# Patient Record
Sex: Female | Born: 1950 | Race: White | Hispanic: No | Marital: Married | Smoking: Never smoker
Health system: Southern US, Community
[De-identification: ages and names within clinical notes are randomized; demographics above are authoritative.]

## PROBLEM LIST (undated history)

## (undated) DIAGNOSIS — E119 Type 2 diabetes mellitus without complications: Secondary | ICD-10-CM

## (undated) DIAGNOSIS — J45909 Unspecified asthma, uncomplicated: Secondary | ICD-10-CM

## (undated) HISTORY — PX: EYE SURGERY: SHX253

---

## 1998-03-19 ENCOUNTER — Other Ambulatory Visit: Admission: RE | Admit: 1998-03-19 | Discharge: 1998-03-19 | Payer: Self-pay | Admitting: Obstetrics and Gynecology

## 1998-04-20 ENCOUNTER — Other Ambulatory Visit: Admission: RE | Admit: 1998-04-20 | Discharge: 1998-04-20 | Payer: Self-pay | Admitting: *Deleted

## 1998-11-30 ENCOUNTER — Other Ambulatory Visit: Admission: RE | Admit: 1998-11-30 | Discharge: 1998-11-30 | Payer: Self-pay | Admitting: Obstetrics and Gynecology

## 1999-08-12 ENCOUNTER — Other Ambulatory Visit: Admission: RE | Admit: 1999-08-12 | Discharge: 1999-08-12 | Payer: Self-pay | Admitting: Gynecology

## 1999-08-30 ENCOUNTER — Ambulatory Visit: Admission: RE | Admit: 1999-08-30 | Discharge: 1999-08-30 | Payer: Self-pay | Admitting: Otolaryngology

## 2001-03-16 ENCOUNTER — Emergency Department (HOSPITAL_COMMUNITY): Admission: EM | Admit: 2001-03-16 | Discharge: 2001-03-16 | Payer: Self-pay | Admitting: Emergency Medicine

## 2002-01-05 ENCOUNTER — Other Ambulatory Visit: Admission: RE | Admit: 2002-01-05 | Discharge: 2002-01-05 | Payer: Self-pay | Admitting: Gynecology

## 2002-03-31 ENCOUNTER — Emergency Department (HOSPITAL_COMMUNITY): Admission: EM | Admit: 2002-03-31 | Discharge: 2002-04-01 | Payer: Self-pay | Admitting: Emergency Medicine

## 2002-08-24 ENCOUNTER — Emergency Department (HOSPITAL_COMMUNITY): Admission: EM | Admit: 2002-08-24 | Discharge: 2002-08-24 | Payer: Self-pay

## 2002-11-23 ENCOUNTER — Emergency Department (HOSPITAL_COMMUNITY): Admission: EM | Admit: 2002-11-23 | Discharge: 2002-11-24 | Payer: Self-pay | Admitting: Emergency Medicine

## 2003-03-10 ENCOUNTER — Other Ambulatory Visit: Admission: RE | Admit: 2003-03-10 | Discharge: 2003-03-10 | Payer: Self-pay | Admitting: Gynecology

## 2003-06-22 ENCOUNTER — Emergency Department (HOSPITAL_COMMUNITY): Admission: EM | Admit: 2003-06-22 | Discharge: 2003-06-22 | Payer: Self-pay | Admitting: Emergency Medicine

## 2003-06-25 ENCOUNTER — Emergency Department (HOSPITAL_COMMUNITY): Admission: EM | Admit: 2003-06-25 | Discharge: 2003-06-25 | Payer: Self-pay | Admitting: Emergency Medicine

## 2004-02-17 ENCOUNTER — Emergency Department (HOSPITAL_COMMUNITY): Admission: EM | Admit: 2004-02-17 | Discharge: 2004-02-17 | Payer: Self-pay | Admitting: Emergency Medicine

## 2004-04-05 ENCOUNTER — Other Ambulatory Visit: Admission: RE | Admit: 2004-04-05 | Discharge: 2004-04-05 | Payer: Self-pay | Admitting: Gynecology

## 2005-05-05 ENCOUNTER — Emergency Department (HOSPITAL_COMMUNITY): Admission: EM | Admit: 2005-05-05 | Discharge: 2005-05-06 | Payer: Self-pay | Admitting: Emergency Medicine

## 2005-12-01 ENCOUNTER — Emergency Department (HOSPITAL_COMMUNITY): Admission: EM | Admit: 2005-12-01 | Discharge: 2005-12-01 | Payer: Self-pay | Admitting: Emergency Medicine

## 2006-10-08 ENCOUNTER — Encounter (INDEPENDENT_AMBULATORY_CARE_PROVIDER_SITE_OTHER): Payer: Self-pay | Admitting: Specialist

## 2006-10-08 ENCOUNTER — Ambulatory Visit (HOSPITAL_COMMUNITY): Admission: RE | Admit: 2006-10-08 | Discharge: 2006-10-09 | Payer: Self-pay | Admitting: Otolaryngology

## 2006-12-10 ENCOUNTER — Encounter: Admission: RE | Admit: 2006-12-10 | Discharge: 2006-12-10 | Payer: Self-pay | Admitting: Obstetrics and Gynecology

## 2007-07-23 ENCOUNTER — Emergency Department (HOSPITAL_COMMUNITY): Admission: EM | Admit: 2007-07-23 | Discharge: 2007-07-23 | Payer: Self-pay | Admitting: Emergency Medicine

## 2007-07-25 ENCOUNTER — Emergency Department (HOSPITAL_COMMUNITY): Admission: EM | Admit: 2007-07-25 | Discharge: 2007-07-25 | Payer: Self-pay | Admitting: Emergency Medicine

## 2007-12-22 ENCOUNTER — Encounter: Admission: RE | Admit: 2007-12-22 | Discharge: 2007-12-22 | Payer: Self-pay | Admitting: Obstetrics and Gynecology

## 2008-12-29 ENCOUNTER — Ambulatory Visit: Payer: Self-pay | Admitting: Diagnostic Radiology

## 2008-12-29 ENCOUNTER — Ambulatory Visit (HOSPITAL_BASED_OUTPATIENT_CLINIC_OR_DEPARTMENT_OTHER): Admission: RE | Admit: 2008-12-29 | Discharge: 2008-12-29 | Payer: Self-pay | Admitting: Obstetrics and Gynecology

## 2009-05-18 DIAGNOSIS — R0609 Other forms of dyspnea: Secondary | ICD-10-CM | POA: Insufficient documentation

## 2009-05-18 DIAGNOSIS — R0989 Other specified symptoms and signs involving the circulatory and respiratory systems: Secondary | ICD-10-CM | POA: Insufficient documentation

## 2009-05-18 DIAGNOSIS — E785 Hyperlipidemia, unspecified: Secondary | ICD-10-CM | POA: Insufficient documentation

## 2009-05-18 DIAGNOSIS — F411 Generalized anxiety disorder: Secondary | ICD-10-CM | POA: Insufficient documentation

## 2009-05-18 DIAGNOSIS — E119 Type 2 diabetes mellitus without complications: Secondary | ICD-10-CM | POA: Insufficient documentation

## 2009-05-21 ENCOUNTER — Encounter: Payer: Self-pay | Admitting: Pulmonary Disease

## 2009-12-31 ENCOUNTER — Ambulatory Visit (HOSPITAL_BASED_OUTPATIENT_CLINIC_OR_DEPARTMENT_OTHER): Admission: RE | Admit: 2009-12-31 | Discharge: 2009-12-31 | Payer: Self-pay | Admitting: Obstetrics and Gynecology

## 2009-12-31 ENCOUNTER — Ambulatory Visit: Payer: Self-pay | Admitting: Radiology

## 2010-05-25 ENCOUNTER — Emergency Department (HOSPITAL_BASED_OUTPATIENT_CLINIC_OR_DEPARTMENT_OTHER): Admission: EM | Admit: 2010-05-25 | Discharge: 2010-05-25 | Payer: Self-pay | Admitting: Emergency Medicine

## 2010-11-03 ENCOUNTER — Emergency Department (HOSPITAL_COMMUNITY)
Admission: EM | Admit: 2010-11-03 | Discharge: 2010-11-03 | Payer: Self-pay | Source: Home / Self Care | Admitting: Emergency Medicine

## 2010-11-03 ENCOUNTER — Emergency Department (HOSPITAL_COMMUNITY)
Admission: EM | Admit: 2010-11-03 | Discharge: 2010-11-04 | Payer: Self-pay | Source: Home / Self Care | Admitting: Emergency Medicine

## 2010-11-11 LAB — URINALYSIS, ROUTINE W REFLEX MICROSCOPIC
Bilirubin Urine: NEGATIVE
Hgb urine dipstick: NEGATIVE
Ketones, ur: NEGATIVE mg/dL
Leukocytes, UA: NEGATIVE
Nitrite: NEGATIVE
Protein, ur: NEGATIVE mg/dL
Specific Gravity, Urine: 1.036 — ABNORMAL HIGH (ref 1.005–1.030)
Urine Glucose, Fasting: >1000 mg/dL — AB
Urobilinogen, UA: 0.2 mg/dL (ref 0.0–1.0)
pH: 5 (ref 5.0–8.0)

## 2010-11-11 LAB — CBC
HCT: 35.1 % — ABNORMAL LOW (ref 36.0–46.0)
Hemoglobin: 12.1 g/dL (ref 12.0–15.0)
MCH: 27.9 pg (ref 26.0–34.0)
MCHC: 34.5 g/dL (ref 30.0–36.0)
MCV: 80.9 fL (ref 78.0–100.0)
Platelets: 357 10*3/uL (ref 150–400)
RBC: 4.34 MIL/uL (ref 3.87–5.11)
RDW: 13.1 % (ref 11.5–15.5)
WBC: 17.4 10*3/uL — ABNORMAL HIGH (ref 4.0–10.5)

## 2010-11-11 LAB — BLOOD GAS, ARTERIAL
Acid-base deficit: 3.7 mmol/L — ABNORMAL HIGH (ref 0.0–2.0)
Bicarbonate: 20.3 mEq/L (ref 20.0–24.0)
Drawn by: 257701
FIO2: 0.21 %
O2 Saturation: 97.7 %
Patient temperature: 98.6
TCO2: 18.7 mmol/L (ref 0–100)
pCO2 arterial: 34.6 mmHg — ABNORMAL LOW (ref 35.0–45.0)
pH, Arterial: 7.385 (ref 7.350–7.400)
pO2, Arterial: 97.3 mmHg (ref 80.0–100.0)

## 2010-11-11 LAB — DIFFERENTIAL
Basophils Absolute: 0 10*3/uL (ref 0.0–0.1)
Basophils Relative: 0 % (ref 0–1)
Eosinophils Absolute: 0 10*3/uL (ref 0.0–0.7)
Eosinophils Relative: 0 % (ref 0–5)
Lymphocytes Relative: 11 % — ABNORMAL LOW (ref 12–46)
Lymphs Abs: 1.8 10*3/uL (ref 0.7–4.0)
Monocytes Absolute: 1.1 10*3/uL — ABNORMAL HIGH (ref 0.1–1.0)
Monocytes Relative: 6 % (ref 3–12)
Neutro Abs: 14.4 10*3/uL — ABNORMAL HIGH (ref 1.7–7.7)
Neutrophils Relative %: 83 % — ABNORMAL HIGH (ref 43–77)

## 2010-11-11 LAB — BASIC METABOLIC PANEL
BUN: 17 mg/dL (ref 6–23)
CO2: 20 mEq/L (ref 19–32)
Calcium: 9.9 mg/dL (ref 8.4–10.5)
Chloride: 99 mEq/L (ref 96–112)
Creatinine, Ser: 1.06 mg/dL (ref 0.4–1.2)
GFR calc Af Amer: >60 mL/min (ref >60)
GFR calc non Af Amer: 53 mL/min — ABNORMAL LOW (ref >60)
Glucose, Bld: 571 mg/dL (ref 70–99)
Potassium: 4.5 mEq/L (ref 3.5–5.1)
Sodium: 131 mEq/L — ABNORMAL LOW (ref 135–145)

## 2010-11-11 LAB — GLUCOSE, CAPILLARY
Glucose-Capillary: >600 mg/dL (ref 70–99)
Glucose-Capillary: 348 mg/dL — ABNORMAL HIGH (ref 70–99)
Glucose-Capillary: 225 mg/dL — ABNORMAL HIGH (ref 70–99)

## 2010-11-11 LAB — URINE MICROSCOPIC-ADD ON

## 2010-11-17 ENCOUNTER — Encounter: Payer: Self-pay | Admitting: Obstetrics and Gynecology

## 2011-03-05 DIAGNOSIS — H1870 Unspecified corneal deformity: Secondary | ICD-10-CM | POA: Insufficient documentation

## 2011-03-14 NOTE — H&P (Signed)
NAMEMarland Mccoy  Kim, Mccoy NO.:  [id number]   MEDICAL RECORD NO.:  [id number]          PATIENT TYPE:  AMB   LOCATION:  SDS                          FACILITY:  [facility]   PHYSICIAN:  Hermelinda Medicus, M.D.   DATE OF BIRTH:  10-Jul-1951   DATE OF ADMISSION:  10/08/2006  DATE OF DISCHARGE:                              HISTORY & PHYSICAL   This patient is a 60 year old female who is a patient of Dr. Quintella Reichert and  also Dr. Beaulah Dinning.  She has been on medication for allergies but  apparently never had any allergy treatment via allergy regularly taking  allergy shots.  She has been on steroids recently for her asthma and has  had a flu shot recently.  She has had a long-term history of asthma and  has been treated for this in the past.  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  She now enters for a septal reconstruction turbinate  reduction in the hopes that she will not only improve her nasal airway  but also improve her breathing as well as her sinus issues and her  asthma issues.   PAST MEDICAL HISTORY:  HER PAST HISTORY REVEALS SHE IS ALLERGIC TO Z-  PAK, E-MYCIN, THE MYCIN DRUGS.   MEDICATIONS:  1. Celexa 20.  2. Prempro 0.3/1.5 mg daily.  3. Zyrtec 10.  4. Metformin 500 mg twice a day.  5. Ventolin inhaler which she uses p.r.n.   Her further lab work does show her glucose to  be 143 this morning.  Chest x-ray showed no active disease.  EKG was normal sinus rhythm.  Her  last asthma attack was October 2007, and she is watching her sugar but  still takes the diabetic medication.   Further history is one of having had a tubal ligation, appendectomy,  ovarian cyst removed.  Her last usual CBG's are 130 to 140.   PHYSICAL EXAMINATION:  VITAL SIGNS:  Blood pressure 151/83, pulse 88.  HEENT:  Ears are clear.  Tympanic membranes are clear.   The nose shows  septal deviation especially to the right with a large bone spur as well  as an ethmoid deviation to the right with a left inferior turbinate  hypertrophy.  Her oral cavity is clear.  Her larynx is clear, true  cords, false cords, epiglottic space, and tongue are clear of ulceration  or mass.  True cord, __________, gag reflex and facial nerve are all  symmetrical.  NECK:  The neck is free of any thyromegaly, cervical adenopathy, or  mass.  CHEST:  Clear, no rales, rhonchi, or wheezes.  CARDIOVASCULAR:  No S3, S4, murmurs, rubs, or gallops.  ABDOMEN:  Unremarkable.  EXTREMITIES:  Unremarkable.   INITIAL DIAGNOSES:  1. Septal deviation.  2. History of sinusitis and bronchitis.  3. History of asthma.  4. History of diabetes.  5. History of tubal ligation, appendectomy, and ovarian cyst.  ______________________________  Hermelinda Medicus, M.D.     JC/MEDQ  D:  10/08/2006  T:  10/08/2006  Job:  [id number]   cc:   Lacretia Leigh. Quintella Reichert, M.D.  Stephannie Li, M.D.

## 2011-03-14 NOTE — Op Note (Signed)
Kim Mccoy, Kim Mccoy              ACCOUNT NO.:  [id number]   MEDICAL RECORD NO.:  [id number]          PATIENT TYPE:  AMB   LOCATION:  SDS                          FACILITY:  [facility]   PHYSICIAN:  Hermelinda Medicus, M.D.   DATE OF BIRTH:  Nov 19, 1950   DATE OF PROCEDURE:  10/08/2006  DATE OF DISCHARGE:                               OPERATIVE REPORT   PREOPERATIVE DIAGNOSIS:  Septal deviation, turbinate hypertrophy, nasal  obstruction, history of bronchitis, history of asthma, history of  sinusitis.   POSTOPERATIVE DIAGNOSIS:  Septal deviation, turbinate hypertrophy, nasal  obstruction, history of bronchitis, history of asthma, history of  sinusitis.   OPERATION:  Septal reconstruction, turbinate reduction.   OPERATOR:  Dr. Hermelinda Medicus.   ANESTHESIA:  Is local MAC with Dr. Gelene Mink.   PROCEDURE:  The patient was placed in supine position, and under local  MAC anesthesia, we prepped and draped in the usual manner using the  usual head drape, and then the anesthesia was begun using topical  cocaine 200 mg and 1% Xylocaine with epinephrine using 6 mL.  The septum  was first approached making a hemitransfixion incision on the right  side, carrying it back along the quadrilateral cartilage to the  cartilaginous deviation, and a strip of cartilage was taken from the  posterior aspect and also from the floor of the nose.  The premaxillary  spur was taken down the using the 4-mm chisel, and this was removed  using the Takahashi forceps.  The open and closed Philipp Ovens were  also used to remove the ethmoid and a portion of vomerine septal  deviation, and then further vomerine spur was pushing right over into  the side of the nose on the right side, and this was removed using the  open and closed Philipp Ovens and the Griffith forceps.  Once the  septum was brought back to the midline, the closure was begun using 5-0  plain catgut and through-and-through septal suture using 4-0  plain x2.  Once this was completed, the inferior turbinates were outfractured  aggressively in the _________ using a 12 was used to also shrink the  mucous membranes of the inferior turbinates.  Once this was completed,  the intranasal dressing was then placed, and the patient tolerated the  procedure well and will be kept overnight because of her asthma history  and on 23-hour observation.   Follow-up furthermore will be then in 5 days, in 2 weeks, 4 weeks and 6  weeks and then 3 months.           ______________________________  Hermelinda Medicus, M.D.     JC/MEDQ  D:  10/08/2006  T:  10/08/2006  Job:  [id number]   cc:   Lacretia Leigh. Quintella Reichert, M.D.  Beaulah Dinning, M.D.

## 2011-03-31 DIAGNOSIS — H1810 Bullous keratopathy, unspecified eye: Secondary | ICD-10-CM | POA: Insufficient documentation

## 2011-03-31 DIAGNOSIS — H43819 Vitreous degeneration, unspecified eye: Secondary | ICD-10-CM | POA: Insufficient documentation

## 2011-03-31 DIAGNOSIS — H2700 Aphakia, unspecified eye: Secondary | ICD-10-CM | POA: Insufficient documentation

## 2011-04-14 ENCOUNTER — Other Ambulatory Visit (HOSPITAL_BASED_OUTPATIENT_CLINIC_OR_DEPARTMENT_OTHER): Payer: Self-pay | Admitting: Obstetrics and Gynecology

## 2011-04-14 DIAGNOSIS — Z1231 Encounter for screening mammogram for malignant neoplasm of breast: Secondary | ICD-10-CM

## 2011-04-17 ENCOUNTER — Ambulatory Visit (HOSPITAL_BASED_OUTPATIENT_CLINIC_OR_DEPARTMENT_OTHER)
Admission: RE | Admit: 2011-04-17 | Discharge: 2011-04-17 | Disposition: A | Discharge status: Home / Self Care | Payer: 59 | Source: Ambulatory Visit | Attending: Obstetrics and Gynecology | Admitting: Obstetrics and Gynecology

## 2011-04-17 DIAGNOSIS — Z1231 Encounter for screening mammogram for malignant neoplasm of breast: Secondary | ICD-10-CM | POA: Insufficient documentation

## 2011-08-07 LAB — DIFFERENTIAL
Basophils Absolute: 0.3 — ABNORMAL HIGH
Basophils Relative: 1
Basophils Relative: 2 — ABNORMAL HIGH
Eosinophils Absolute: 0.3
Eosinophils Absolute: 0.4
Eosinophils Relative: 3
Eosinophils Relative: 3
Lymphocytes Relative: 19
Lymphs Abs: 3.1
Monocytes Absolute: 0.6
Monocytes Absolute: 0.9 — ABNORMAL HIGH
Monocytes Relative: 6
Monocytes Relative: 6
Neutro Abs: 11.6 — ABNORMAL HIGH
Neutrophils Relative %: 59
Neutrophils Relative %: 71

## 2011-08-07 LAB — BASIC METABOLIC PANEL
BUN: 7
BUN: 8
CO2: 25
CO2: 27
Calcium: 10.1
Calcium: 9.3
Chloride: 100
Chloride: 97
Creatinine, Ser: 0.62
Creatinine, Ser: 0.83
GFR calc Af Amer: >60
GFR calc Af Amer: >60
GFR calc non Af Amer: >60
GFR calc non Af Amer: >60
Glucose, Bld: 273 — ABNORMAL HIGH
Glucose, Bld: 380 — ABNORMAL HIGH
Potassium: 3.6
Potassium: 4
Sodium: 131 — ABNORMAL LOW
Sodium: 135

## 2011-08-07 LAB — CBC
HCT: 37.8
HCT: 38.4
Hemoglobin: 13.4
Hemoglobin: 13.4
MCHC: 34.8
MCHC: 35.6
MCV: 81
MCV: 81.6
Platelets: 452 — ABNORMAL HIGH
RBC: 4.67
RBC: 4.7
RDW: 12.7
WBC: 16.3 — ABNORMAL HIGH

## 2011-08-07 LAB — URINE MICROSCOPIC-ADD ON

## 2011-08-07 LAB — URINALYSIS, ROUTINE W REFLEX MICROSCOPIC
Bilirubin Urine: NEGATIVE
Bilirubin Urine: NEGATIVE
Glucose, UA: >1000 — AB
Hgb urine dipstick: NEGATIVE
Hgb urine dipstick: NEGATIVE
Ketones, ur: 15 — AB
Ketones, ur: NEGATIVE
Leukocytes, UA: NEGATIVE
Nitrite: NEGATIVE
Protein, ur: NEGATIVE
Protein, ur: NEGATIVE
Specific Gravity, Urine: 1.038 — ABNORMAL HIGH
Urobilinogen, UA: 0.2
Urobilinogen, UA: 1
pH: 5.5

## 2012-03-09 ENCOUNTER — Other Ambulatory Visit (HOSPITAL_BASED_OUTPATIENT_CLINIC_OR_DEPARTMENT_OTHER): Payer: Self-pay | Admitting: Obstetrics and Gynecology

## 2012-03-09 DIAGNOSIS — Z1231 Encounter for screening mammogram for malignant neoplasm of breast: Secondary | ICD-10-CM

## 2012-04-16 ENCOUNTER — Ambulatory Visit (HOSPITAL_BASED_OUTPATIENT_CLINIC_OR_DEPARTMENT_OTHER)
Admission: RE | Admit: 2012-04-16 | Discharge: 2012-04-16 | Disposition: A | Discharge status: Home / Self Care | Payer: 59 | Source: Ambulatory Visit | Attending: Obstetrics and Gynecology | Admitting: Obstetrics and Gynecology

## 2012-04-16 DIAGNOSIS — Z1231 Encounter for screening mammogram for malignant neoplasm of breast: Secondary | ICD-10-CM

## 2012-08-04 ENCOUNTER — Encounter (HOSPITAL_BASED_OUTPATIENT_CLINIC_OR_DEPARTMENT_OTHER): Payer: Self-pay | Admitting: *Deleted

## 2012-08-04 ENCOUNTER — Emergency Department (HOSPITAL_BASED_OUTPATIENT_CLINIC_OR_DEPARTMENT_OTHER)
Admission: EM | Admit: 2012-08-04 | Discharge: 2012-08-04 | Disposition: A | Discharge status: Home / Self Care | Payer: 59 | Attending: Emergency Medicine | Admitting: Emergency Medicine

## 2012-08-04 DIAGNOSIS — E119 Type 2 diabetes mellitus without complications: Secondary | ICD-10-CM | POA: Insufficient documentation

## 2012-08-04 DIAGNOSIS — R739 Hyperglycemia, unspecified: Secondary | ICD-10-CM

## 2012-08-04 HISTORY — DX: Type 2 diabetes mellitus without complications: E11.9

## 2012-08-04 LAB — COMPREHENSIVE METABOLIC PANEL
ALT: 10 U/L (ref 0–35)
CO2: 21 mEq/L (ref 19–32)
Calcium: 9.8 mg/dL (ref 8.4–10.5)
Chloride: 102 mEq/L (ref 96–112)
Creatinine, Ser: 0.8 mg/dL (ref 0.50–1.10)
GFR calc Af Amer: >90 mL/min (ref >90)
GFR calc non Af Amer: 79 mL/min — ABNORMAL LOW (ref >90)
Glucose, Bld: 197 mg/dL — ABNORMAL HIGH (ref 70–99)
Sodium: 136 mEq/L (ref 135–145)
Total Bilirubin: 1.1 mg/dL (ref 0.3–1.2)

## 2012-08-04 LAB — CBC WITH DIFFERENTIAL/PLATELET
Eosinophils Relative: 1 % (ref 0–5)
HCT: 40.2 % (ref 36.0–46.0)
Lymphocytes Relative: 18 % (ref 12–46)
Lymphs Abs: 1.9 10*3/uL (ref 0.7–4.0)
MCV: 88.9 fL (ref 78.0–100.0)
Monocytes Absolute: 0.7 10*3/uL (ref 0.1–1.0)
Neutro Abs: 7.5 10*3/uL (ref 1.7–7.7)
RBC: 4.52 MIL/uL (ref 3.87–5.11)
WBC: 10.3 10*3/uL (ref 4.0–10.5)

## 2012-08-04 LAB — URINALYSIS, ROUTINE W REFLEX MICROSCOPIC
Bilirubin Urine: NEGATIVE
Glucose, UA: NEGATIVE mg/dL
Hgb urine dipstick: NEGATIVE
Ketones, ur: NEGATIVE mg/dL
Protein, ur: NEGATIVE mg/dL
Urobilinogen, UA: 1 mg/dL (ref 0.0–1.0)

## 2012-08-04 LAB — GLUCOSE, CAPILLARY: Glucose-Capillary: 195 mg/dL — ABNORMAL HIGH (ref 70–99)

## 2012-08-04 MED ORDER — ONDANSETRON HCL 4 MG/2ML IJ SOLN
4.0000 mg | Freq: Once | INTRAMUSCULAR | Status: AC
  Administered 2012-08-04: 4 mg via INTRAVENOUS
  Filled 2012-08-04: qty 2

## 2012-08-04 MED ORDER — SODIUM CHLORIDE 0.9 % IV BOLUS (SEPSIS)
1000.0000 mL | Freq: Once | INTRAVENOUS | Status: AC
  Administered 2012-08-04: 1000 mL via INTRAVENOUS

## 2012-08-04 NOTE — ED Provider Notes (Signed)
History     CSN: [id number]  Arrival date & time 08/04/12  5366   First MD Initiated Contact with Patient 08/04/12 [phone]      Chief Complaint  Patient presents with  . Hyperglycemia    (Consider location/radiation/quality/duration/timing/severity/associated sxs/prior treatment) HPI Patient changed from [place] to metformin Friday and since change on Saturday patient states she is feeling sick- nausea, bs is higher than it has been at 200-220.  Patient thinks the metformin changed to glipizide two days ago and now on 10 mg one time per day.  Continues to feel nauseated.  Called her eye surgeon and diamox to be changed.  Patient seen at urgent care at her doctor's on Sunday because she was vomiting and meds changed.  Patient has felt hot and sweaty  But denies fever or diarrhea.  Decreased po.  Patient denies exposure to any recent illness.  She thinks she may need fluids today.   Past Medical History  Diagnosis Date  . Diabetes mellitus without complication     Past Surgical History  Procedure Date  . Eye surgery     History reviewed. No pertinent family history.  History  Substance Use Topics  . Smoking status: Never Smoker   . Smokeless tobacco: Not on file  . Alcohol Use: No    OB History    Grav Para Term Preterm Abortions TAB SAB Ect Mult Living                  Review of Systems  Constitutional: Negative for fever, chills, activity change, appetite change and unexpected weight change.  HENT: Negative for sore throat, rhinorrhea, neck pain, neck stiffness and sinus pressure.   Eyes: Negative for visual disturbance.  Respiratory: Negative for cough and shortness of breath.   Cardiovascular: Negative for chest pain and leg swelling.  Gastrointestinal: Negative for vomiting, abdominal pain, diarrhea and blood in stool.  Genitourinary: Negative for dysuria, urgency, frequency, vaginal discharge and difficulty urinating.  Musculoskeletal: Negative for myalgias,  arthralgias and gait problem.  Skin: Negative for color change and rash.  Neurological: Negative for weakness, light-headedness and headaches.  Hematological: Does not bruise/bleed easily.  Psychiatric/Behavioral: Negative for dysphoric mood.    Allergies  Erythromycin and Hydrocodone  Home Medications   Current Outpatient Rx  Name Route Sig Dispense Refill  . ACETAZOLAMIDE ER 500 MG PO CP12 Oral Take 500 mg by mouth 2 (two) times daily.    Marland Kitchen BRIMONIDINE TARTRATE 0.1 % OP SOLN      . DORZOLAMIDE HCL-TIMOLOL MAL 22.3-6.8 MG/ML OP SOLN  1 drop 2 (two) times daily.    Marland Kitchen GLIPIZIDE 10 MG PO TABS Oral Take 10 mg by mouth 2 (two) times daily before a meal.    . LATANOPROST 0.005 % OP SOLN  1 drop at bedtime.    Marland Kitchen METFORMIN HCL 500 MG PO TABS Oral Take 500 mg by mouth daily with breakfast. Has not taken since Sunday, pt states she feels this is what is making her feel so fatigued.    Marland Kitchen ZOLPIDEM TARTRATE 10 MG PO TABS Oral Take 10 mg by mouth at bedtime as needed.      BP 132/94  Pulse 66  Temp 98.2 F (36.8 C) (Oral)  Resp 16  Ht 5' 6.5" (1.689 m)  Wt 162 lb (73.483 kg)  BMI 25.76 kg/m2  SpO2 100%  Physical Exam  Nursing note and vitals reviewed. Constitutional: She appears well-developed and well-nourished.  HENT:  Head: Normocephalic and  atraumatic.  Eyes: Conjunctivae normal and EOM are normal. Pupils are equal, round, and reactive to light.  Neck: Normal range of motion. Neck supple.  Cardiovascular: Normal rate, regular rhythm, normal heart sounds and intact distal pulses.   Pulmonary/Chest: Effort normal and breath sounds normal.  Abdominal: Soft. Bowel sounds are normal.  Musculoskeletal: Normal range of motion.  Neurological: She is alert.  Skin: Skin is warm and dry.  Psychiatric: She has a normal mood and affect. Thought content normal.    ED Course  Procedures (including critical care time)  Labs Reviewed  GLUCOSE, CAPILLARY - Abnormal; Notable for the  following:    Glucose-Capillary 195 (*)     All other components within normal limits   No results found.   No diagnosis found. Results for orders placed during the hospital encounter of 08/04/12  GLUCOSE, CAPILLARY      Component Value Range   Glucose-Capillary 195 (*) 70 - 99 mg/dL  CBC WITH DIFFERENTIAL      Component Value Range   WBC 10.3  4.0 - 10.5 K/uL   RBC 4.52  3.87 - 5.11 MIL/uL   Hemoglobin 13.0  12.0 - 15.0 g/dL   HCT 40.9  81.1 - 91.4 %   MCV 88.9  78.0 - 100.0 fL   MCH 28.8  26.0 - 34.0 pg   MCHC 32.3  30.0 - 36.0 g/dL   RDW 78.2  95.6 - 21.3 %   Platelets 326  150 - 400 K/uL   Neutrophils Relative 73  43 - 77 %   Neutro Abs 7.5  1.7 - 7.7 K/uL   Lymphocytes Relative 18  12 - 46 %   Lymphs Abs 1.9  0.7 - 4.0 K/uL   Monocytes Relative 7  3 - 12 %   Monocytes Absolute 0.7  0.1 - 1.0 K/uL   Eosinophils Relative 1  0 - 5 %   Eosinophils Absolute 0.1  0.0 - 0.7 K/uL   Basophils Relative 1  0 - 1 %   Basophils Absolute 0.1  0.0 - 0.1 K/uL  COMPREHENSIVE METABOLIC PANEL      Component Value Range   Sodium 136  135 - 145 mEq/L   Potassium 3.7  3.5 - 5.1 mEq/L   Chloride 102  96 - 112 mEq/L   CO2 21  19 - 32 mEq/L   Glucose, Bld 197 (*) 70 - 99 mg/dL   BUN 10  6 - 23 mg/dL   Creatinine, Ser 0.86  0.50 - 1.10 mg/dL   Calcium 9.8  8.4 - 57.8 mg/dL   Total Protein 7.0  6.0 - 8.3 g/dL   Albumin 4.5  3.5 - 5.2 g/dL   AST 13  0 - 37 U/L   ALT 10  0 - 35 U/L   Alkaline Phosphatase 113  39 - 117 U/L   Total Bilirubin 1.1  0.3 - 1.2 mg/dL   GFR calc non Af Amer 79 (*) >90 mL/min   GFR calc Af Amer >90  >90 mL/min  LIPASE, BLOOD      Component Value Range   Lipase 29  11 - 59 U/L  URINALYSIS, ROUTINE W REFLEX MICROSCOPIC      Component Value Range   Color, Urine YELLOW  YELLOW   APPearance CLEAR  CLEAR   Specific Gravity, Urine 1.007  1.005 - 1.030   pH 6.5  5.0 - 8.0   Glucose, UA NEGATIVE  NEGATIVE mg/dL   Hgb urine dipstick NEGATIVE  NEGATIVE   Bilirubin  Urine NEGATIVE  NEGATIVE   Ketones, ur NEGATIVE  NEGATIVE mg/dL   Protein, ur NEGATIVE  NEGATIVE mg/dL   Urobilinogen, UA 1.0  0.0 - 1.0 mg/dL   Nitrite NEGATIVE  NEGATIVE   Leukocytes, UA NEGATIVE  NEGATIVE      MDM   Patient received 1 L normal saline and feels greatly improved. I have reviewed her labs with her and discussed the hyperglycemia. She will follow with her doctor and decide about any changes needed medication. Not had any vomiting or fever while here and again states that she feels greatly improved after the liter of normal saline.     Hilario Quarry, MD 08/04/12 1101

## 2012-08-04 NOTE — ED Notes (Signed)
Pt states that she saw her pcp on Friday, he adjusted her hyperglycemic meds and her fsbs have been over 200 since then. She was seen at urgent care on Sunday for fsbs of 201. Pt states the md called in glipizide for her at her insistence, "he really didn't want to though". Pt states her fsbs was 220 this am and she has been feeling fatigued and nauseous, denies any emesis, diarrhea, fevers or any other c/o at this time. fsbs checked while pt being triaged, 195.

## 2012-08-26 DIAGNOSIS — H4050X0 Glaucoma secondary to other eye disorders, unspecified eye, stage unspecified: Secondary | ICD-10-CM | POA: Insufficient documentation

## 2012-08-27 DIAGNOSIS — J45909 Unspecified asthma, uncomplicated: Secondary | ICD-10-CM | POA: Insufficient documentation

## 2012-11-15 DIAGNOSIS — F4323 Adjustment disorder with mixed anxiety and depressed mood: Secondary | ICD-10-CM | POA: Insufficient documentation

## 2012-11-15 DIAGNOSIS — G4700 Insomnia, unspecified: Secondary | ICD-10-CM | POA: Insufficient documentation

## 2013-02-11 ENCOUNTER — Other Ambulatory Visit (HOSPITAL_BASED_OUTPATIENT_CLINIC_OR_DEPARTMENT_OTHER): Payer: Self-pay | Admitting: Obstetrics and Gynecology

## 2013-02-11 DIAGNOSIS — Z1231 Encounter for screening mammogram for malignant neoplasm of breast: Secondary | ICD-10-CM

## 2013-04-02 DIAGNOSIS — E559 Vitamin D deficiency, unspecified: Secondary | ICD-10-CM | POA: Insufficient documentation

## 2013-04-18 ENCOUNTER — Ambulatory Visit (HOSPITAL_BASED_OUTPATIENT_CLINIC_OR_DEPARTMENT_OTHER)
Admission: RE | Admit: 2013-04-18 | Discharge: 2013-04-18 | Disposition: A | Discharge status: Home / Self Care | Payer: 59 | Source: Ambulatory Visit | Attending: Obstetrics and Gynecology | Admitting: Obstetrics and Gynecology

## 2013-04-18 DIAGNOSIS — Z1231 Encounter for screening mammogram for malignant neoplasm of breast: Secondary | ICD-10-CM | POA: Insufficient documentation

## 2014-03-13 ENCOUNTER — Other Ambulatory Visit (HOSPITAL_BASED_OUTPATIENT_CLINIC_OR_DEPARTMENT_OTHER): Payer: Self-pay | Admitting: Obstetrics and Gynecology

## 2014-03-13 DIAGNOSIS — Z1231 Encounter for screening mammogram for malignant neoplasm of breast: Secondary | ICD-10-CM

## 2014-05-19 ENCOUNTER — Ambulatory Visit (HOSPITAL_BASED_OUTPATIENT_CLINIC_OR_DEPARTMENT_OTHER)
Admission: RE | Admit: 2014-05-19 | Discharge: 2014-05-19 | Disposition: A | Discharge status: Home / Self Care | Payer: 59 | Source: Ambulatory Visit | Attending: Obstetrics and Gynecology | Admitting: Obstetrics and Gynecology

## 2014-05-19 DIAGNOSIS — Z1231 Encounter for screening mammogram for malignant neoplasm of breast: Secondary | ICD-10-CM

## 2015-05-07 ENCOUNTER — Other Ambulatory Visit (HOSPITAL_BASED_OUTPATIENT_CLINIC_OR_DEPARTMENT_OTHER): Payer: Self-pay | Admitting: Obstetrics and Gynecology

## 2015-05-07 DIAGNOSIS — Z1231 Encounter for screening mammogram for malignant neoplasm of breast: Secondary | ICD-10-CM

## 2015-06-12 ENCOUNTER — Ambulatory Visit (HOSPITAL_BASED_OUTPATIENT_CLINIC_OR_DEPARTMENT_OTHER): Payer: Self-pay

## 2015-06-22 ENCOUNTER — Ambulatory Visit (HOSPITAL_BASED_OUTPATIENT_CLINIC_OR_DEPARTMENT_OTHER)
Admission: RE | Admit: 2015-06-22 | Discharge: 2015-06-22 | Disposition: A | Discharge status: Home / Self Care | Payer: 59 | Source: Ambulatory Visit | Attending: Obstetrics and Gynecology | Admitting: Obstetrics and Gynecology

## 2015-06-22 DIAGNOSIS — Z1231 Encounter for screening mammogram for malignant neoplasm of breast: Secondary | ICD-10-CM | POA: Diagnosis not present

## 2015-07-12 ENCOUNTER — Ambulatory Visit: Payer: 59 | Admitting: Family Medicine

## 2015-07-16 ENCOUNTER — Ambulatory Visit (INDEPENDENT_AMBULATORY_CARE_PROVIDER_SITE_OTHER): Payer: 59 | Admitting: Family Medicine

## 2015-07-16 ENCOUNTER — Encounter (INDEPENDENT_AMBULATORY_CARE_PROVIDER_SITE_OTHER): Payer: Self-pay

## 2015-07-16 ENCOUNTER — Encounter: Payer: Self-pay | Admitting: Family Medicine

## 2015-07-16 VITALS — BP 132/82 | HR 80 | Ht 66.0 in | Wt 170.0 lb

## 2015-07-16 DIAGNOSIS — M25512 Pain in left shoulder: Secondary | ICD-10-CM | POA: Diagnosis not present

## 2015-07-16 MED ORDER — METHYLPREDNISOLONE ACETATE 40 MG/ML IJ SUSP
40.0000 mg | Freq: Once | INTRAMUSCULAR | Status: AC
  Administered 2015-07-16: 40 mg via INTRA_ARTICULAR

## 2015-07-16 NOTE — Patient Instructions (Signed)
You have rotator cuff impingement Try to avoid painful activities (overhead activities, lifting with extended arm) as much as possible. Aleve 2 tabs twice a day with food OR ibuprofen 3 tabs three times a day with food as needed for pain and inflammation. Can take tylenol in addition to this. Subacromial injection may be beneficial to help with pain and to decrease inflammation - you were given this today. Consider physical therapy with transition to home exercise program - call me in 1-2 weeks if not improving as expected and we can send a referral to [facility] for physical therapy. Do home exercise program with theraband and scapular stabilization exercises daily - these are very important for long term relief even if an injection was given. 3 sets of 10 once a day with the band. If not improving at follow-up we will consider further imaging, physical therapy, and/or nitro patches. Follow up with me in 5-6 weeks for reevaluation.

## 2015-07-18 DIAGNOSIS — M25512 Pain in left shoulder: Secondary | ICD-10-CM | POA: Insufficient documentation

## 2015-07-18 NOTE — Progress Notes (Signed)
PCP: Rosario Adie, MD  Subjective:   HPI: Patient is a 64 y.o. female here for left shoulder pain.  Patient states for at least 6 months she's had left shoulder pain. Has fallen on this several times but did not start with a specific event. Difficulty lying on left side. Pain will come and go but up to 10/10 with certain movements. Worse reaching back and forwards. Right handed. Works on [employer].  Past Medical History  Diagnosis Date  . Diabetes mellitus without complication     Current Outpatient Prescriptions on File Prior to Visit  Medication Sig Dispense Refill  . latanoprost (XALATAN) 0.005 % ophthalmic solution 1 drop at bedtime.    Marland Kitchen zolpidem (AMBIEN) 10 MG tablet Take 10 mg by mouth at bedtime as needed.     No current facility-administered medications on file prior to visit.    Past Surgical History  Procedure Laterality Date  . Eye surgery      Allergies  Allergen Reactions  . Erythromycin   . Hydrocodone     Social History   Social History  . Marital Status: Married    Spouse Name: N/A  . Number of Children: N/A  . Years of Education: N/A   Occupational History  . Not on file.   Social History Main Topics  . Smoking status: Never Smoker   . Smokeless tobacco: Not on file  . Alcohol Use: No  . Drug Use: No  . Sexual Activity: Not on file   Other Topics Concern  . Not on file   Social History Narrative    No family history on file.  BP 132/82 mmHg  Pulse 80  Ht  (1.676 m)  Wt 170 lb (77.111 kg)  BMI 27.45 kg/m2  Review of Systems: See HPI above.    Objective:  Physical Exam:  Gen: NAD  Left shoulder: No swelling, ecchymoses.  No gross deformity. No TTP. FROM with painful arc. Positive Hawkins, Neers. Negative Speeds, Yergasons. Strength 5/5 with empty can and resisted internal/external rotation.  Pain empty can Negative apprehension. NV intact distally.    Assessment & Plan:  1. Left shoulder rotator cuff  impingement - subacromial injection given today.  Discussed nsaids, icing, tylenol.  If not improving consider physical therapy.  Shown home exercises today as well.  F/u in 5-6 weeks.  After informed written consent, patient was seated on exam table. Left shoulder was prepped with alcohol swab and utilizing posterior approach, patient's left subacromial space was injected with 3:1 marcaine: depomedrol. Patient tolerated the procedure well without immediate complications.

## 2015-07-18 NOTE — Assessment & Plan Note (Signed)
subacromial injection given today.  Discussed nsaids, icing, tylenol.  If not improving consider physical therapy.  Shown home exercises today as well.  F/u in 5-6 weeks.  After informed written consent, patient was seated on exam table. Left shoulder was prepped with alcohol swab and utilizing posterior approach, patient's left subacromial space was injected with 3:1 marcaine: depomedrol. Patient tolerated the procedure well without immediate complications.

## 2015-07-31 ENCOUNTER — Telehealth: Payer: Self-pay | Admitting: *Deleted

## 2015-07-31 MED ORDER — NITROGLYCERIN 0.2 MG/HR TD PT24: MEDICATED_PATCH | TRANSDERMAL | Status: DC

## 2015-07-31 NOTE — Telephone Encounter (Signed)
Ok - sent nitro in.  Thanks!

## 2015-07-31 NOTE — Telephone Encounter (Signed)
Patient called stating she was having shoulder pain. She would like to try the nitro patches. Pharmacy is the [facility] in [place].

## 2015-08-29 ENCOUNTER — Ambulatory Visit: Payer: 59 | Admitting: Family Medicine

## 2015-08-29 ENCOUNTER — Telehealth: Payer: Self-pay | Admitting: Family Medicine

## 2015-08-29 NOTE — Telephone Encounter (Signed)
Spoke to patient and she wants to do the MRI.

## 2015-08-29 NOTE — Telephone Encounter (Signed)
We could do this one of two ways.  Typically I see them back first to reevaluate, make sure she's still dealing with the same problem, then order the imaging (x-rays and MRI), call her with the results.  Alternatively we could do the imaging (x-rays, MRI) and see her in the office for follow-up and go over results, next steps.  Either way I would need to see her and I prefer to do the first option.  Insurance usually won't approve the MRI unless you've done x-rays and been reevaluated after 6 weeks of conservative treatment.  If I've only seen her once they may deny it - I'm not sure though, depends on the insurance.

## 2015-09-06 ENCOUNTER — Other Ambulatory Visit: Payer: Self-pay | Admitting: *Deleted

## 2015-09-06 DIAGNOSIS — M25512 Pain in left shoulder: Secondary | ICD-10-CM

## 2015-09-15 ENCOUNTER — Ambulatory Visit (HOSPITAL_BASED_OUTPATIENT_CLINIC_OR_DEPARTMENT_OTHER)
Admission: RE | Admit: 2015-09-15 | Discharge: 2015-09-15 | Disposition: A | Discharge status: Home / Self Care | Payer: 59 | Source: Ambulatory Visit | Attending: Family Medicine | Admitting: Family Medicine

## 2015-09-15 ENCOUNTER — Other Ambulatory Visit: Payer: Self-pay | Admitting: Family Medicine

## 2015-09-15 DIAGNOSIS — M75112 Incomplete rotator cuff tear or rupture of left shoulder, not specified as traumatic: Secondary | ICD-10-CM | POA: Insufficient documentation

## 2015-09-15 DIAGNOSIS — S0095XA Superficial foreign body of unspecified part of head, initial encounter: Secondary | ICD-10-CM

## 2015-09-15 DIAGNOSIS — Z1389 Encounter for screening for other disorder: Secondary | ICD-10-CM | POA: Diagnosis not present

## 2015-09-15 DIAGNOSIS — M25512 Pain in left shoulder: Secondary | ICD-10-CM | POA: Diagnosis not present

## 2015-09-15 DIAGNOSIS — M25812 Other specified joint disorders, left shoulder: Secondary | ICD-10-CM | POA: Insufficient documentation

## 2015-09-15 DIAGNOSIS — M7522 Bicipital tendinitis, left shoulder: Secondary | ICD-10-CM | POA: Insufficient documentation

## 2015-09-15 DIAGNOSIS — M7582 Other shoulder lesions, left shoulder: Secondary | ICD-10-CM | POA: Insufficient documentation

## 2015-09-18 ENCOUNTER — Encounter: Payer: Self-pay | Admitting: Family Medicine

## 2015-09-18 ENCOUNTER — Ambulatory Visit (INDEPENDENT_AMBULATORY_CARE_PROVIDER_SITE_OTHER): Payer: 59 | Admitting: Family Medicine

## 2015-09-18 VITALS — BP 110/76 | HR 96 | Ht 66.0 in | Wt 175.0 lb

## 2015-09-18 DIAGNOSIS — M25512 Pain in left shoulder: Secondary | ICD-10-CM

## 2015-09-18 MED ORDER — METHYLPREDNISOLONE ACETATE 40 MG/ML IJ SUSP
40.0000 mg | Freq: Once | INTRAMUSCULAR | Status: AC
  Administered 2015-09-18: 40 mg via INTRA_ARTICULAR

## 2015-09-24 NOTE — Progress Notes (Signed)
PCP: MANNING, Drue Stager., MD  Subjective:   HPI: Patient is a 64 y.o. female here for left shoulder pain.  9/19: Patient states for at least 6 months she's had left shoulder pain. Has fallen on this several times but did not start with a specific event. Difficulty lying on left side. Pain will come and go but up to 92/42 with certain movements. Worse reaching back and forwards. 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  11/22: Patient returns for intraarticular injection.  Past Medical History  Diagnosis Date  . Diabetes mellitus without complication [facility])     Current Outpatient Prescriptions on File Prior to Visit  Medication Sig Dispense Refill  . ACCU-CHEK AVIVA PLUS test strip 2 (two) times daily. for testing  0  . albuterol (PROVENTIL HFA;VENTOLIN HFA) 108 (90 BASE) MCG/ACT inhaler Inhale 2 puffs into the lungs.    Marland Kitchen atorvastatin (LIPITOR) 20 MG tablet Take 1 tablet by mouth  daily    . Blood Glucose Monitoring Suppl (ACCU-CHEK AVIVA PLUS) W/DEVICE KIT See admin instructions.  0  . clonazePAM (KLONOPIN) 1 MG tablet TAKE ONE TABLET BY MOUTH TWICE A DAY IF NEEDED.  0  . Fenofibrate 50 MG CAPS Take 50 mg by mouth daily.  0  . latanoprost (XALATAN) 0.005 % ophthalmic solution 1 drop at bedtime.    . nitroGLYCERIN (NITRODUR - DOSED IN MG/24 HR) 0.2 mg/hr patch Apply 1/4th patch to affected shoulder, change daily 30 patch 1  . pioglitazone-glimepiride (DUETACT) 30-2 MG per tablet     . prednisoLONE acetate (PRED FORTE) 1 % ophthalmic suspension Apply to eye.    Marland Kitchen QUEtiapine (SEROQUEL) 50 MG tablet Take 50 mg by mouth at bedtime.  0  . timolol (TIMOPTIC) 0.5 % ophthalmic solution Apply to eye.    . Vitamin D, Ergocalciferol, (DRISDOL) 50000 UNITS CAPS capsule   0  . zolpidem (AMBIEN) 10 MG tablet Take 10 mg by mouth at bedtime as needed.     No current facility-administered medications on file prior to visit.    Past Surgical History  Procedure Laterality Date  . Eye surgery       Allergies  Allergen Reactions  . Erythromycin   . Hydrocodone     Social History   Social History  . Marital Status: Married    Spouse Name: N/A  . Number of Children: N/A  . Years of Education: N/A   Occupational History  . Not on file.   Social History Main Topics  . Smoking status: Never Smoker   . Smokeless tobacco: Not on file  . Alcohol Use: No  . Drug Use: No  . Sexual Activity: Not on file   Other Topics Concern  . Not on file   Social History Narrative    No family history on file.  BP 110/76 mmHg  Pulse 96  Ht '5\' 6"'  (1.676 m)  Wt 175 lb (79.379 kg)  BMI 28.26 kg/m2  Review of Systems: See HPI above.    Objective:  Physical Exam:  Gen: NAD Exam not repeated today.  Left shoulder: No swelling, ecchymoses.  No gross deformity. No TTP. FROM with painful arc. Positive Hawkins, Neers. Negative Speeds, Yergasons. Strength 5/5 with empty can and resisted internal/external rotation.  Pain empty can Negative apprehension. NV intact distally.    Assessment & Plan:  1. Left shoulder pain - MRI showed multilobulated paralabral cysts in addition to supraspinatus tendinosis.  She did not improve with injection or home exercises as would expect if  the tendinosis was her main issue.  Cysts suggest either a current small labral tear or one that previously healed.  Discussed options and went ahead with trial of intraarticular cortisone injection.  If not improving would consider ortho referral or trial of physical therapy.    After informed written consent, patient was seated on exam table. Left shoulder was prepped with alcohol swab and utilizing posterior approach, patient's left glenohumeral space was injected with 3:1 marcaine: depomedrol. Patient tolerated the procedure well without immediate complications.

## 2015-09-24 NOTE — Assessment & Plan Note (Signed)
MRI showed multilobulated paralabral cysts in addition to supraspinatus tendinosis.  She did not improve with injection or home exercises as would expect if the tendinosis was her main issue.  Cysts suggest either a current small labral tear or one that previously healed.  Discussed options and went ahead with trial of intraarticular cortisone injection.  If not improving would consider ortho referral or trial of physical therapy.    After informed written consent, patient was seated on exam table. Left shoulder was prepped with alcohol swab and utilizing posterior approach, patient's left glenohumeral space was injected with 3:1 marcaine: depomedrol. Patient tolerated the procedure well without immediate complications.

## 2016-01-28 ENCOUNTER — Encounter: Payer: Self-pay | Admitting: Family Medicine

## 2016-01-28 ENCOUNTER — Ambulatory Visit: Payer: 59 | Admitting: Family Medicine

## 2016-01-28 ENCOUNTER — Telehealth: Payer: Self-pay | Admitting: Family Medicine

## 2016-01-28 ENCOUNTER — Ambulatory Visit (INDEPENDENT_AMBULATORY_CARE_PROVIDER_SITE_OTHER): Payer: 59 | Admitting: Family Medicine

## 2016-01-28 VITALS — BP 112/77 | HR 75 | Ht 66.0 in | Wt 175.0 lb

## 2016-01-28 DIAGNOSIS — S92331A Displaced fracture of third metatarsal bone, right foot, initial encounter for closed fracture: Secondary | ICD-10-CM

## 2016-01-28 MED ORDER — HYDROCODONE-ACETAMINOPHEN 5-325 MG PO TABS: 1.0000 | ORAL_TABLET | Freq: Four times a day (QID) | ORAL | Status: DC | PRN

## 2016-01-28 NOTE — Patient Instructions (Signed)
You have a 3rd metatarsal shaft fracture. This should heal well with conservative treatment. Icing 15 minutes at a time 3-4 times a day. Ok to take ibuprofen or aleve as needed. Norco as needed for severe pain. Elevate above your heart for swelling when possible. Wear boot when up and walking around - it's ok to put weight on this foot. Follow up with  Me in 2 weeks. Out of work in the meantime.

## 2016-01-28 NOTE — Telephone Encounter (Signed)
She only needs to wear it when she's up and walking around.  She doesn't need to wear it at night unless she prefers to.

## 2016-01-28 NOTE — Telephone Encounter (Signed)
Spoke to patient and gave her information provided by physician. 

## 2016-01-29 ENCOUNTER — Telehealth: Payer: Self-pay | Admitting: Family Medicine

## 2016-01-29 DIAGNOSIS — S92331D Displaced fracture of third metatarsal bone, right foot, subsequent encounter for fracture with routine healing: Secondary | ICD-10-CM | POA: Insufficient documentation

## 2016-01-29 NOTE — Progress Notes (Signed)
PCP: MANNING, Drue Stager., MD  Subjective:   HPI: Patient is a 65 y.o. female here for right foot injury.  Patient reports on 3/25 when at [facility] she accidentally inverted her right ankle on a slope. Able to continue walking but pain worsened along with swelling. Went to urgent care on Wednesday due to pain. Pain level now 5/10, sharp in ball of foot area. Icing, taking norco. Took aleve initially. No prior injuries here. No skin changes, fever.  Past Medical History  Diagnosis Date  . Diabetes mellitus without complication [facility])     Current Outpatient Prescriptions on File Prior to Visit  Medication Sig Dispense Refill  . ACCU-CHEK AVIVA PLUS test strip 2 (two) times daily. for testing  0  . albuterol (PROVENTIL HFA;VENTOLIN HFA) 108 (90 BASE) MCG/ACT inhaler Inhale 2 puffs into the lungs.    Marland Kitchen atorvastatin (LIPITOR) 20 MG tablet Take 1 tablet by mouth  daily    . Blood Glucose Monitoring Suppl (ACCU-CHEK AVIVA PLUS) W/DEVICE KIT See admin instructions.  0  . clonazePAM (KLONOPIN) 1 MG tablet TAKE ONE TABLET BY MOUTH TWICE A DAY IF NEEDED.  0  . Fenofibrate 50 MG CAPS Take 50 mg by mouth daily.  0  . JANUVIA 100 MG tablet     . latanoprost (XALATAN) 0.005 % ophthalmic solution 1 drop at bedtime.    . montelukast (SINGULAIR) 10 MG tablet Take 10 mg by mouth at bedtime.  0  . nitroGLYCERIN (NITRODUR - DOSED IN MG/24 HR) 0.2 mg/hr patch Apply 1/4th patch to affected shoulder, change daily 30 patch 1  . pioglitazone-glimepiride (DUETACT) 30-2 MG per tablet     . prednisoLONE acetate (PRED FORTE) 1 % ophthalmic suspension Apply to eye.    Marland Kitchen QUEtiapine (SEROQUEL) 50 MG tablet Take 50 mg by mouth at bedtime.  0  . timolol (TIMOPTIC) 0.5 % ophthalmic solution Apply to eye.    . Vitamin D, Ergocalciferol, (DRISDOL) 50000 UNITS CAPS capsule   0  . zolpidem (AMBIEN) 10 MG tablet Take 10 mg by mouth at bedtime as needed.     No current facility-administered medications on file prior to  visit.    Past Surgical History  Procedure Laterality Date  . Eye surgery      Allergies  Allergen Reactions  . Erythromycin   . Hydrocodone   . Penicillin G     Social History   Social History  . Marital Status: Married    Spouse Name: N/A  . Number of Children: N/A  . Years of Education: N/A   Occupational History  . Not on file.   Social History Main Topics  . Smoking status: Never Smoker   . Smokeless tobacco: Not on file  . Alcohol Use: No  . Drug Use: No  . Sexual Activity: Not on file   Other Topics Concern  . Not on file   Social History Narrative    No family history on file.  BP 112/77 mmHg  Pulse 75  Ht '5\' 6"'  (1.676 m)  Wt 175 lb (79.379 kg)  BMI 28.26 kg/m2  Review of Systems: See HPI above.    Objective:  Physical Exam:  Gen: NAD, comfortable in exam room  Right foot/ankle: Mild dorsal foot swelling.  No bruising, other deformity. FROM TTP right 3rd > 4th metatarsal shafts. Negative ant drawer and talar tilt.   Negative syndesmotic compression. Positive metatarsal squeeze. Thompsons test negative. NV intact distally.  MSK u/s Right foot:  Cortical irregularity  of 3rd metatarsal shaft with edema, neovascularity.  Some edema overlying 4th metatarsal but no neovascularity of cortical irregularity to suggest fracture.    Assessment & Plan:  1. Right 3rd metatarsal fracture - independently performed and reviewed MSK u/s - 3rd metatarsal shaft fracture identified.  Icing, nsaid with norco as needed.  Elevation for swelling.  Cam walker but can weight bear as tolerated.  F/u in 2 weeks.  Out of work while on pain medication.  Expect 6 total weeks for this to heal.

## 2016-01-29 NOTE — Assessment & Plan Note (Signed)
independently performed and reviewed MSK u/s - 3rd metatarsal shaft fracture identified.  Icing, nsaid with norco as needed.  Elevation for swelling.  Cam walker but can weight bear as tolerated.  F/u in 2 weeks.  Out of work while on pain medication.  Expect 6 total weeks for this to heal.

## 2016-01-30 NOTE — Telephone Encounter (Signed)
Paperwork has been filled out and placed in fax pile.  Thanks!

## 2016-02-07 ENCOUNTER — Telehealth: Payer: Self-pay | Admitting: Family Medicine

## 2016-02-07 MED ORDER — TRAMADOL HCL 50 MG PO TABS
50.0000 mg | ORAL_TABLET | Freq: Four times a day (QID) | ORAL | Status: DC | PRN
Start: 2016-02-07 — End: 2017-02-16

## 2016-02-07 NOTE — Telephone Encounter (Signed)
Called in tramadol to [facility] [phone], patient aware

## 2016-02-07 NOTE — Telephone Encounter (Signed)
She could do tramadol instead - no guarantee this also wouldn't make her nauseous.  It would have to be printed out though as all pain medications do.  Does she want this?

## 2016-02-11 ENCOUNTER — Encounter: Payer: Self-pay | Admitting: Family Medicine

## 2016-02-11 ENCOUNTER — Ambulatory Visit (INDEPENDENT_AMBULATORY_CARE_PROVIDER_SITE_OTHER): Payer: 59 | Admitting: Family Medicine

## 2016-02-11 VITALS — BP 142/88 | HR 84 | Ht 66.0 in | Wt 175.0 lb

## 2016-02-11 DIAGNOSIS — S92331D Displaced fracture of third metatarsal bone, right foot, subsequent encounter for fracture with routine healing: Secondary | ICD-10-CM

## 2016-02-11 NOTE — Patient Instructions (Signed)
Your fracture is healing extremely well. Continue with the boot for 2 more weeks when up and walking around. Icing, norco if needed. Follow up with me in 2 weeks. I'm hopeful we'll be able to switch you to a regular shoe and return you to work at that time.

## 2016-02-12 NOTE — Progress Notes (Signed)
PCP: MANNING, Drue Stager., MD  Subjective:   HPI: Patient is a 65 y.o. female here for right foot injury.  4/3: Patient reports on 3/25 when at [facility] she accidentally inverted her right ankle on a slope. Able to continue walking but pain worsened along with swelling. Went to urgent care on Wednesday due to pain. Pain level now 5/10, sharp in ball of foot area. Icing, taking norco. Took aleve initially. No prior injuries here. No skin changes, fever.  4/17: Patient reports she is doing well. Swells by end of day. Pain level is only 3/10, more dull now in ball of foot. Gets a sharp pain sometimes in back of heel. Taking tramadol as needed. No skin changes, fever.  Past Medical History  Diagnosis Date  . Diabetes mellitus without complication [facility])     Current Outpatient Prescriptions on File Prior to Visit  Medication Sig Dispense Refill  . ACCU-CHEK AVIVA PLUS test strip 2 (two) times daily. for testing  0  . albuterol (PROVENTIL HFA;VENTOLIN HFA) 108 (90 BASE) MCG/ACT inhaler Inhale 2 puffs into the lungs.    Marland Kitchen atorvastatin (LIPITOR) 20 MG tablet Take 1 tablet by mouth  daily    . Blood Glucose Monitoring Suppl (ACCU-CHEK AVIVA PLUS) W/DEVICE KIT See admin instructions.  0  . clonazePAM (KLONOPIN) 1 MG tablet TAKE ONE TABLET BY MOUTH TWICE A DAY IF NEEDED.  0  . Fenofibrate 50 MG CAPS Take 50 mg by mouth daily.  0  . HYDROcodone-acetaminophen (NORCO) 5-325 MG tablet Take 1 tablet by mouth every 6 (six) hours as needed. 60 tablet 0  . JANUVIA 100 MG tablet     . latanoprost (XALATAN) 0.005 % ophthalmic solution 1 drop at bedtime.    . montelukast (SINGULAIR) 10 MG tablet Take 10 mg by mouth at bedtime.  0  . nitroGLYCERIN (NITRODUR - DOSED IN MG/24 HR) 0.2 mg/hr patch Apply 1/4th patch to affected shoulder, change daily 30 patch 1  . pioglitazone-glimepiride (DUETACT) 30-2 MG per tablet     . prednisoLONE acetate (PRED FORTE) 1 % ophthalmic suspension Apply to eye.    Marland Kitchen  QUEtiapine (SEROQUEL) 50 MG tablet Take 50 mg by mouth at bedtime.  0  . timolol (TIMOPTIC) 0.5 % ophthalmic solution Apply to eye.    . traMADol (ULTRAM) 50 MG tablet Take 1 tablet (50 mg total) by mouth every 6 (six) hours as needed. 60 tablet 0  . Vitamin D, Ergocalciferol, (DRISDOL) 50000 UNITS CAPS capsule   0  . zolpidem (AMBIEN) 10 MG tablet Take 10 mg by mouth at bedtime as needed.     No current facility-administered medications on file prior to visit.    Past Surgical History  Procedure Laterality Date  . Eye surgery      Allergies  Allergen Reactions  . Erythromycin   . Hydrocodone   . Penicillin G     Social History   Social History  . Marital Status: Married    Spouse Name: N/A  . Number of Children: N/A  . Years of Education: N/A   Occupational History  . Not on file.   Social History Main Topics  . Smoking status: Never Smoker   . Smokeless tobacco: Not on file  . Alcohol Use: No  . Drug Use: No  . Sexual Activity: Not on file   Other Topics Concern  . Not on file   Social History Narrative    No family history on file.  BP 142/88 mmHg  Pulse 84  Ht '5\' 6"'  (1.676 m)  Wt 175 lb (79.379 kg)  BMI 28.26 kg/m2  Review of Systems: See HPI above.    Objective:  Physical Exam:  Gen: NAD, comfortable in exam room  Right foot/ankle: Minimal dorsal foot swelling.  No bruising, other deformity. FROM TTP right 3rd > 4th metatarsal shafts mildly. Negative ant drawer and talar tilt.   Negative syndesmotic compression. Positive metatarsal squeeze. Thompsons test negative. NV intact distally.  MSK u/s Right foot:  Interval callus formation noted of 3rd metatarsal shaft with edema, neovascularity.    Assessment & Plan:  1. Right 3rd metatarsal fracture - independently performed and reviewed MSK u/s - 3rd metatarsal shaft fracture identified with excellent callus formation and healing to date.  Icing, nsaid with tramadol or norco as needed.   Elevation for swelling.  Cam walker but can weight bear as tolerated for 2 more weeks at least.  F/u at that time.

## 2016-02-12 NOTE — Assessment & Plan Note (Addendum)
independently performed and reviewed MSK u/s - 3rd metatarsal shaft fracture identified with excellent callus formation and healing to date.  Icing, nsaid with tramadol or norco as needed.  Elevation for swelling.  Cam walker but can weight bear as tolerated for 2 more weeks at least.  F/u at that time.

## 2016-02-18 ENCOUNTER — Telehealth: Payer: Self-pay | Admitting: Family Medicine

## 2016-02-22 ENCOUNTER — Other Ambulatory Visit: Payer: Self-pay | Admitting: Obstetrics and Gynecology

## 2016-02-22 ENCOUNTER — Other Ambulatory Visit (HOSPITAL_BASED_OUTPATIENT_CLINIC_OR_DEPARTMENT_OTHER): Payer: Self-pay | Admitting: Obstetrics and Gynecology

## 2016-02-22 DIAGNOSIS — N63 Unspecified lump in unspecified breast: Secondary | ICD-10-CM

## 2016-02-22 DIAGNOSIS — Z1231 Encounter for screening mammogram for malignant neoplasm of breast: Secondary | ICD-10-CM

## 2016-02-25 ENCOUNTER — Other Ambulatory Visit: Payer: 59

## 2016-02-25 ENCOUNTER — Inpatient Hospital Stay (HOSPITAL_BASED_OUTPATIENT_CLINIC_OR_DEPARTMENT_OTHER): Admission: RE | Admit: 2016-02-25 | Payer: 59 | Source: Ambulatory Visit

## 2016-02-25 ENCOUNTER — Encounter: Payer: Self-pay | Admitting: Family Medicine

## 2016-02-25 ENCOUNTER — Ambulatory Visit (INDEPENDENT_AMBULATORY_CARE_PROVIDER_SITE_OTHER): Payer: 59 | Admitting: Family Medicine

## 2016-02-25 VITALS — BP 151/88 | HR 80 | Ht 66.0 in | Wt 170.0 lb

## 2016-02-25 DIAGNOSIS — S92331D Displaced fracture of third metatarsal bone, right foot, subsequent encounter for fracture with routine healing: Secondary | ICD-10-CM

## 2016-02-25 NOTE — Assessment & Plan Note (Signed)
independently performed and reviewed MSK u/s - 3rd metatarsal shaft fracture appears almost completely healed now.  Out of cam walker.  Icing, nsaids as needed.  Elevation for swelling.  Return to work tomorrow full duty.  Call us with any questions or concerns otherwise f/u prn.

## 2016-02-25 NOTE — Progress Notes (Signed)
PCP: MANNING, Drue Stager., MD  Subjective:   HPI: Patient is a 65 y.o. female here for right foot injury.  4/3: Patient reports on 3/25 when at [facility] she accidentally inverted her right ankle on a slope. Able to continue walking but pain worsened along with swelling. Went to urgent care on Wednesday due to pain. Pain level now 5/10, sharp in ball of foot area. Icing, taking norco. Took aleve initially. No prior injuries here. No skin changes, fever.  4/17: Patient reports she is doing well. Swells by end of day. Pain level is only 3/10, more dull now in ball of foot. Gets a sharp pain sometimes in back of heel. Taking tramadol as needed. No skin changes, fever.  5/1: Patient reports she feels much better. A little ache in heel and has had some sharp twinges medial right ankle. Wearing boot at times. Pain level down to 0/10. No skin changes, numbness.   Past Medical History  Diagnosis Date  . Diabetes mellitus without complication [facility])     Current Outpatient Prescriptions on File Prior to Visit  Medication Sig Dispense Refill  . ACCU-CHEK AVIVA PLUS test strip 2 (two) times daily. for testing  0  . albuterol (PROVENTIL HFA;VENTOLIN HFA) 108 (90 BASE) MCG/ACT inhaler Inhale 2 puffs into the lungs.    Marland Kitchen atorvastatin (LIPITOR) 20 MG tablet Take 1 tablet by mouth  daily    . Blood Glucose Monitoring Suppl (ACCU-CHEK AVIVA PLUS) W/DEVICE KIT See admin instructions.  0  . clonazePAM (KLONOPIN) 1 MG tablet TAKE ONE TABLET BY MOUTH TWICE A DAY IF NEEDED.  0  . Fenofibrate 50 MG CAPS Take 50 mg by mouth daily.  0  . HYDROcodone-acetaminophen (NORCO) 5-325 MG tablet Take 1 tablet by mouth every 6 (six) hours as needed. 60 tablet 0  . JANUVIA 100 MG tablet     . latanoprost (XALATAN) 0.005 % ophthalmic solution 1 drop at bedtime.    . montelukast (SINGULAIR) 10 MG tablet Take 10 mg by mouth at bedtime.  0  . nitroGLYCERIN (NITRODUR - DOSED IN MG/24 HR) 0.2 mg/hr patch Apply  1/4th patch to affected shoulder, change daily 30 patch 1  . pioglitazone-glimepiride (DUETACT) 30-2 MG per tablet     . prednisoLONE acetate (PRED FORTE) 1 % ophthalmic suspension Apply to eye.    Marland Kitchen QUEtiapine (SEROQUEL) 50 MG tablet Take 50 mg by mouth at bedtime.  0  . timolol (TIMOPTIC) 0.5 % ophthalmic solution Apply to eye.    . traMADol (ULTRAM) 50 MG tablet Take 1 tablet (50 mg total) by mouth every 6 (six) hours as needed. 60 tablet 0  . Vitamin D, Ergocalciferol, (DRISDOL) 50000 UNITS CAPS capsule   0  . zolpidem (AMBIEN) 10 MG tablet Take 10 mg by mouth at bedtime as needed.     No current facility-administered medications on file prior to visit.    Past Surgical History  Procedure Laterality Date  . Eye surgery      Allergies  Allergen Reactions  . Erythromycin   . Hydrocodone   . Penicillin G     Social History   Social History  . Marital Status: Married    Spouse Name: N/A  . Number of Children: N/A  . Years of Education: N/A   Occupational History  . Not on file.   Social History Main Topics  . Smoking status: Never Smoker   . Smokeless tobacco: Not on file  . Alcohol Use: No  . Drug  Use: No  . Sexual Activity: Not on file   Other Topics Concern  . Not on file   Social History Narrative    No family history on file.  BP 151/88 mmHg  Pulse 80  Ht '5\' 6"'  (1.676 m)  Wt 170 lb (77.111 kg)  BMI 27.45 kg/m2  Review of Systems: See HPI above.    Objective:  Physical Exam:  Gen: NAD, comfortable in exam room  Right foot/ankle: Minimal dorsal foot swelling.  No bruising, other deformity. FROM Minimal TTP 3rd MT. Negative ant drawer and talar tilt.   Negative syndesmotic compression. Negative metatarsal squeeze. Thompsons test negative. NV intact distally.  MSK u/s Right foot:  Excellent callus formation noted of 3rd metatarsal shaft with minimal edema.  No neovascularity now.    Assessment & Plan:  1. Right 3rd metatarsal fracture -  independently performed and reviewed MSK u/s - 3rd metatarsal shaft fracture appears almost completely healed now.  Out of cam walker.  Icing, nsaids as needed.  Elevation for swelling.  Return to work tomorrow full duty.  Call us with any questions or concerns otherwise f/u prn.

## 2016-03-13 ENCOUNTER — Telehealth: Payer: Self-pay | Admitting: Family Medicine

## 2016-03-13 NOTE — Telephone Encounter (Signed)
Spoke to patient and told her to come by office to get some heel lifts to see if that would help. Gave her information provided by the provider.

## 2016-03-13 NOTE — Telephone Encounter (Signed)
She's probably developed some plantar fasciitis if it's the bottom of her foot.  You can review the exercises, icing, arch binder, OTC orthotics with her - if she wants to be seen we can see her as well.

## 2016-03-26 ENCOUNTER — Other Ambulatory Visit: Payer: 59

## 2016-03-31 ENCOUNTER — Encounter: Payer: Self-pay | Admitting: Family Medicine

## 2016-03-31 ENCOUNTER — Ambulatory Visit (INDEPENDENT_AMBULATORY_CARE_PROVIDER_SITE_OTHER): Payer: 59 | Admitting: Family Medicine

## 2016-03-31 VITALS — BP 115/82 | HR 83 | Ht 66.0 in | Wt 180.0 lb

## 2016-03-31 DIAGNOSIS — M79671 Pain in right foot: Secondary | ICD-10-CM

## 2016-03-31 NOTE — Patient Instructions (Signed)
You have metatarsalgia. Your ultrasound is reassuring - you do not have a new stress fracture, there is no neuroma or tendinitis. I would expect this to be completely resolved within the next 2 weeks. Icing 15 minutes at a time 3-4 times a day. Use the voltaren gel 4 times a day for next 1-2 weeks. Avoid flat shoes and barefoot walking even around the house. Use sports insoles - let us know if you need us to alter these. Follow up with me in 2 weeks if not improving as expected.

## 2016-04-01 DIAGNOSIS — M79671 Pain in right foot: Secondary | ICD-10-CM | POA: Insufficient documentation

## 2016-04-01 NOTE — Progress Notes (Signed)
PCP: MANNING, Drue Stager., MD  Subjective:   HPI: Patient is a 65 y.o. female here for right foot injury.  4/3: Patient reports on 3/25 when at [facility] she accidentally inverted her right ankle on a slope. Able to continue walking but pain worsened along with swelling. Went to urgent care on Wednesday due to pain. Pain level now 5/10, sharp in ball of foot area. Icing, taking norco. Took aleve initially. No prior injuries here. No skin changes, fever.  4/17: Patient reports she is doing well. Swells by end of day. Pain level is only 3/10, more dull now in ball of foot. Gets a sharp pain sometimes in back of heel. Taking tramadol as needed. No skin changes, fever.  5/1: Patient reports she feels much better. A little ache in heel and has had some sharp twinges medial right ankle. Wearing boot at times. Pain level down to 0/10. No skin changes, numbness.  6/5: Patient returns with right foot pain. Primarily in 2nd metatarsal area top of foot. No new injuries. Some swelling yesterday - took a tramadol. Pain is 0/10, can be dull when ambulating. Was severe past couple days though. Voltaren gel also tried with some benefit. No skin change, numbness.  Past Medical History  Diagnosis Date  . Diabetes mellitus without complication [facility])     Current Outpatient Prescriptions on File Prior to Visit  Medication Sig Dispense Refill  . ACCU-CHEK AVIVA PLUS test strip 2 (two) times daily. for testing  0  . albuterol (PROVENTIL HFA;VENTOLIN HFA) 108 (90 BASE) MCG/ACT inhaler Inhale 2 puffs into the lungs.    Marland Kitchen atorvastatin (LIPITOR) 20 MG tablet Take 1 tablet by mouth  daily    . Blood Glucose Monitoring Suppl (ACCU-CHEK AVIVA PLUS) W/DEVICE KIT See admin instructions.  0  . clonazePAM (KLONOPIN) 1 MG tablet TAKE ONE TABLET BY MOUTH TWICE A DAY IF NEEDED.  0  . Fenofibrate 50 MG CAPS Take 50 mg by mouth daily.  0  . HYDROcodone-acetaminophen (NORCO) 5-325 MG tablet Take 1 tablet  by mouth every 6 (six) hours as needed. 60 tablet 0  . JANUVIA 100 MG tablet     . latanoprost (XALATAN) 0.005 % ophthalmic solution 1 drop at bedtime.    . montelukast (SINGULAIR) 10 MG tablet Take 10 mg by mouth at bedtime.  0  . nitroGLYCERIN (NITRODUR - DOSED IN MG/24 HR) 0.2 mg/hr patch Apply 1/4th patch to affected shoulder, change daily 30 patch 1  . pioglitazone-glimepiride (DUETACT) 30-2 MG per tablet     . prednisoLONE acetate (PRED FORTE) 1 % ophthalmic suspension Apply to eye.    Marland Kitchen QUEtiapine (SEROQUEL) 50 MG tablet Take 50 mg by mouth at bedtime.  0  . timolol (TIMOPTIC) 0.5 % ophthalmic solution Apply to eye.    . traMADol (ULTRAM) 50 MG tablet Take 1 tablet (50 mg total) by mouth every 6 (six) hours as needed. 60 tablet 0  . Vitamin D, Ergocalciferol, (DRISDOL) 50000 UNITS CAPS capsule   0  . zolpidem (AMBIEN) 10 MG tablet Take 10 mg by mouth at bedtime as needed.     No current facility-administered medications on file prior to visit.    Past Surgical History  Procedure Laterality Date  . Eye surgery      Allergies  Allergen Reactions  . Erythromycin   . Hydrocodone   . Penicillin G     Social History   Social History  . Marital Status: Married    Spouse Name:  N/A  . Number of Children: N/A  . Years of Education: N/A   Occupational History  . Not on file.   Social History Main Topics  . Smoking status: Never Smoker   . Smokeless tobacco: Not on file  . Alcohol Use: No  . Drug Use: No  . Sexual Activity: Not on file   Other Topics Concern  . Not on file   Social History Narrative    No family history on file.  BP 115/82 mmHg  Pulse 83  Ht _0  (1.676 m)  Wt 180 lb (81.647 kg)  BMI 29.07 kg/m2  Review of Systems: See HPI above.    Objective:  Physical Exam:  Gen: NAD, comfortable in exam room  Right foot/ankle: Minimal dorsal foot swelling.  No bruising, other deformity. FROM without pain Minimal TTP 2nd MT. Negative ant drawer  and talar tilt.   Negative syndesmotic compression. Negative metatarsal squeeze. Thompsons test negative. NV intact distally.  MSK u/s Right foot:  No new cortical irregularities of metatarsals.  No abnormalities of extensor tendons.  No neuromas on metatarsal squeeze.  Digital nerves appear normal.    Assessment & Plan:  1. Metatarsalgia - describes metatarsalgia (also reports when went to urgent care metatarsal squeeze was positive).  Ultrasound, exam, history reassuring.  Encouraged icing, voltaren gel, sports insoles.  Avoid flat shoes and barefoot walking.  F/u in 2 weeks if not improving as expected.

## 2016-04-01 NOTE — Assessment & Plan Note (Signed)
describes metatarsalgia (also reports when went to urgent care metatarsal squeeze was positive).  Ultrasound, exam, history reassuring.  Encouraged icing, voltaren gel, sports insoles.  Avoid flat shoes and barefoot walking.  F/u in 2 weeks if not improving as expected.

## 2016-05-26 NOTE — Telephone Encounter (Signed)
FInished °

## 2017-01-09 ENCOUNTER — Ambulatory Visit: Payer: 59 | Admitting: Family

## 2017-02-16 ENCOUNTER — Encounter (INDEPENDENT_AMBULATORY_CARE_PROVIDER_SITE_OTHER): Payer: Self-pay

## 2017-02-16 ENCOUNTER — Encounter: Payer: Self-pay | Admitting: Family Medicine

## 2017-02-16 ENCOUNTER — Ambulatory Visit (INDEPENDENT_AMBULATORY_CARE_PROVIDER_SITE_OTHER): Payer: Managed Care, Other (non HMO) | Admitting: Family Medicine

## 2017-02-16 DIAGNOSIS — M25562 Pain in left knee: Secondary | ICD-10-CM | POA: Diagnosis not present

## 2017-02-16 DIAGNOSIS — S8992XA Unspecified injury of left lower leg, initial encounter: Secondary | ICD-10-CM

## 2017-02-16 DIAGNOSIS — S8992XD Unspecified injury of left lower leg, subsequent encounter: Secondary | ICD-10-CM | POA: Insufficient documentation

## 2017-02-16 MED ORDER — METHYLPREDNISOLONE ACETATE 40 MG/ML IJ SUSP
40.0000 mg | Freq: Once | INTRAMUSCULAR | Status: AC
  Administered 2017-02-16: 40 mg via INTRA_ARTICULAR
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
## 2017-02-16 NOTE — Assessment & Plan Note (Signed)
radiographs negative for fracture or other bony abnormalities.  History, exam consistent with knee contusion, traumatic pes bursitis, and small hematoma.  Reassured.  Icing, aleve twice a day with food.  She was also given pes injection.  Rest of exam was reassuring.  Consider compression sleeve.  Given home exercise program to do as well.  Will f/u in 2 weeks.  After informed written consent, patient was seated on exam table. Left knee was prepped with alcohol swab and left pes bursa was injected with 2:1 bupivicaine: depomedrol. Patient tolerated the procedure well without immediate complications.

## 2017-02-16 NOTE — Patient Instructions (Addendum)
Your exam is reassuring. You have a knee contusion, traumatic pes bursitis, and a small hematoma here. Ice the area 15 minutes at a time 3-4 times a day. Aleve 2 tabs twice a day with food for pain and inflammation. Ok to take tylenol with this. You were given a cortisone shot for the bursal component which should help though can take a few days to kick in. A compression sleeve may help as well with the swelling and pain. Hamstring curls, the hacky sack exercise I showed you 3 sets of 10 once a day without weight. Follow up with me in 2 weeks for reevaluation.

## 2017-02-27 ENCOUNTER — Ambulatory Visit: Payer: 59 | Admitting: Family Medicine

## 2017-03-05 ENCOUNTER — Encounter: Payer: Self-pay | Admitting: Family Medicine

## 2017-03-05 ENCOUNTER — Ambulatory Visit (INDEPENDENT_AMBULATORY_CARE_PROVIDER_SITE_OTHER): Payer: Managed Care, Other (non HMO) | Admitting: Family Medicine

## 2017-03-05 DIAGNOSIS — S8992XD Unspecified injury of left lower leg, subsequent encounter: Secondary | ICD-10-CM

## 2017-03-05 NOTE — Patient Instructions (Signed)
Your exam is reassuring. You have a knee contusion, traumatic pes bursitis. Ice the area 15 minutes at a time 3-4 times a day. Aleve only if needed. Ok to take tylenol with this. Do hamstring curls, hamstring swings, and the hacky sack exercise I showed you 3 sets of 10 once a day without weight. If these become too easy add a 1-2 pound ankle weight. Follow up with me in 1 month. Activities as tolerated - you're not damaging anything by getting up and walking around.

## 2017-03-06 ENCOUNTER — Ambulatory Visit: Payer: Managed Care, Other (non HMO) | Admitting: Family Medicine
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
## 2017-03-10 NOTE — Assessment & Plan Note (Signed)
x-rays negative.  Clinically improving from contusion and pes bursitis though flare over the weekend with a lot of walking.  Reassured.  Icing, aleve if needed.  Shown home exercises to do daily.  Activities as tolerated.  F/u in 1 month.

## 2019-11-21 ENCOUNTER — Ambulatory Visit: Payer: Medicare Other | Attending: Internal Medicine

## 2020-01-06 ENCOUNTER — Other Ambulatory Visit (HOSPITAL_BASED_OUTPATIENT_CLINIC_OR_DEPARTMENT_OTHER): Payer: Self-pay | Admitting: Obstetrics and Gynecology

## 2020-01-06 DIAGNOSIS — Z1231 Encounter for screening mammogram for malignant neoplasm of breast: Secondary | ICD-10-CM

## 2020-01-16 ENCOUNTER — Other Ambulatory Visit (HOSPITAL_COMMUNITY): Payer: Self-pay | Admitting: Orthopedic Surgery

## 2020-01-16 DIAGNOSIS — M79604 Pain in right leg: Secondary | ICD-10-CM

## 2020-01-16 DIAGNOSIS — M7989 Other specified soft tissue disorders: Secondary | ICD-10-CM

## 2020-01-17 ENCOUNTER — Other Ambulatory Visit: Payer: Self-pay

## 2020-01-17 ENCOUNTER — Ambulatory Visit (HOSPITAL_COMMUNITY)
Admission: RE | Admit: 2020-01-17 | Discharge: 2020-01-17 | Disposition: A | Discharge status: Home / Self Care | Payer: Medicare Other | Source: Ambulatory Visit | Attending: Orthopedic Surgery | Admitting: Orthopedic Surgery

## 2020-01-17 DIAGNOSIS — M7989 Other specified soft tissue disorders: Secondary | ICD-10-CM | POA: Insufficient documentation

## 2020-01-17 DIAGNOSIS — M79604 Pain in right leg: Secondary | ICD-10-CM | POA: Insufficient documentation

## 2020-01-17 NOTE — Progress Notes (Signed)
Right lower extremity venous duplex completed. Refer to "CV Proc" under chart review to view preliminary results.  01/17/2020 3:43 PM Eula Fried., MHA, RVT, RDCS, RDMS

## 2020-04-25 ENCOUNTER — Ambulatory Visit (HOSPITAL_BASED_OUTPATIENT_CLINIC_OR_DEPARTMENT_OTHER): Payer: Medicare Other

## 2020-04-27 ENCOUNTER — Other Ambulatory Visit (HOSPITAL_BASED_OUTPATIENT_CLINIC_OR_DEPARTMENT_OTHER): Payer: Self-pay | Admitting: Obstetrics and Gynecology

## 2020-04-27 ENCOUNTER — Other Ambulatory Visit: Payer: Self-pay

## 2020-04-27 ENCOUNTER — Ambulatory Visit (HOSPITAL_BASED_OUTPATIENT_CLINIC_OR_DEPARTMENT_OTHER)
Admission: RE | Admit: 2020-04-27 | Discharge: 2020-04-27 | Disposition: A | Discharge status: Home / Self Care | Payer: Medicare Other | Source: Ambulatory Visit | Attending: Obstetrics and Gynecology | Admitting: Obstetrics and Gynecology

## 2020-04-27 DIAGNOSIS — Z1231 Encounter for screening mammogram for malignant neoplasm of breast: Secondary | ICD-10-CM | POA: Diagnosis present

## 2021-01-19 IMAGING — MG DIGITAL SCREENING BILAT W/ CAD
4 series · 4 of 4 positions shown · non-contrast
Comparison: Previous exam(s).

CLINICAL DATA: Screening.

EXAM:
DIGITAL SCREENING BILATERAL MAMMOGRAM WITH CAD

[R CC]
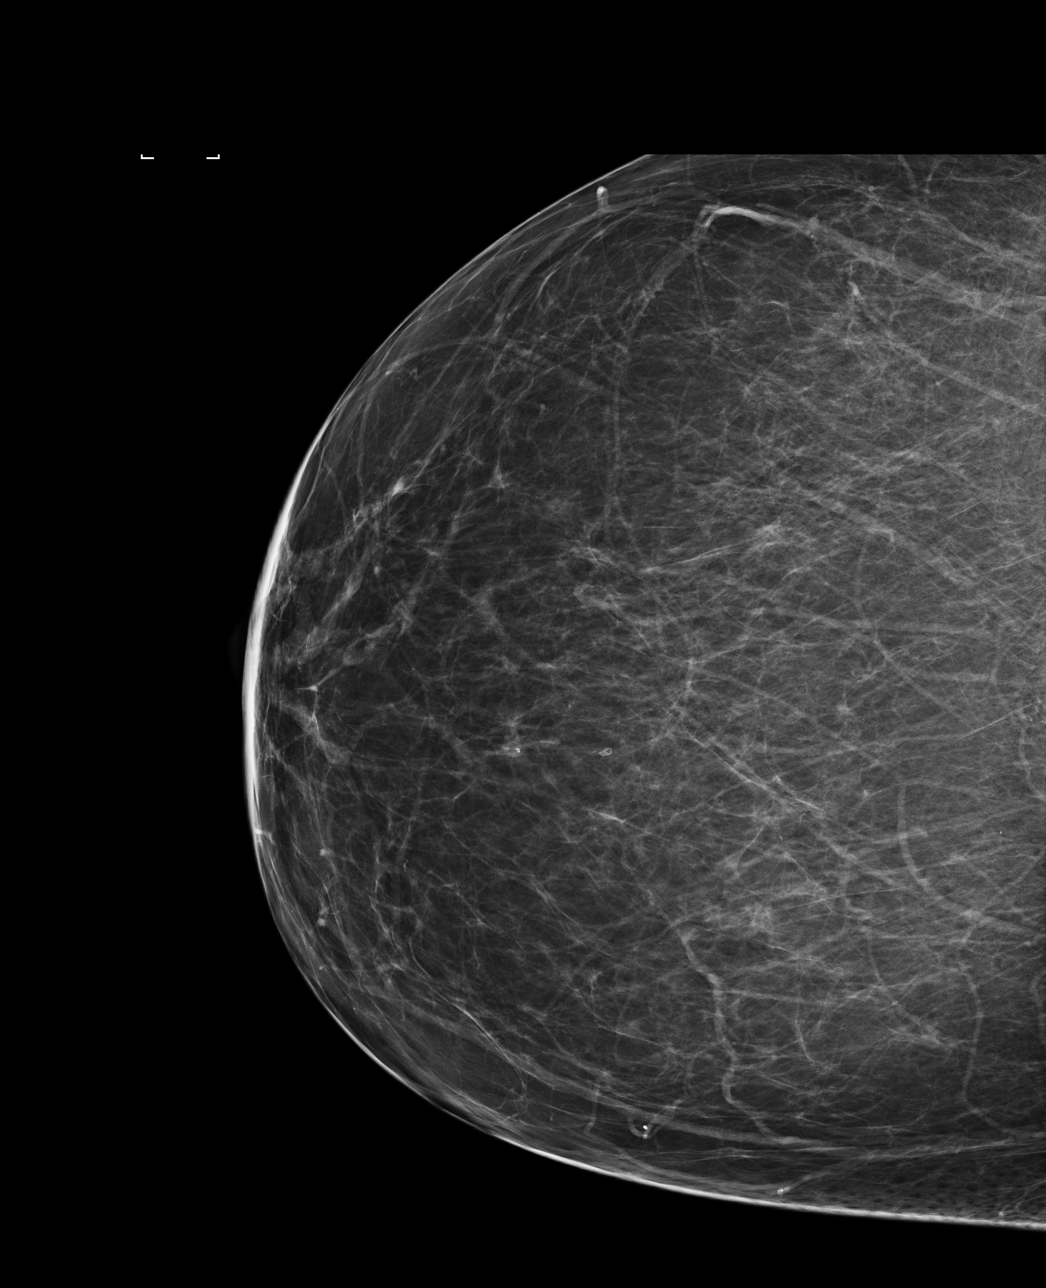

[L CC]
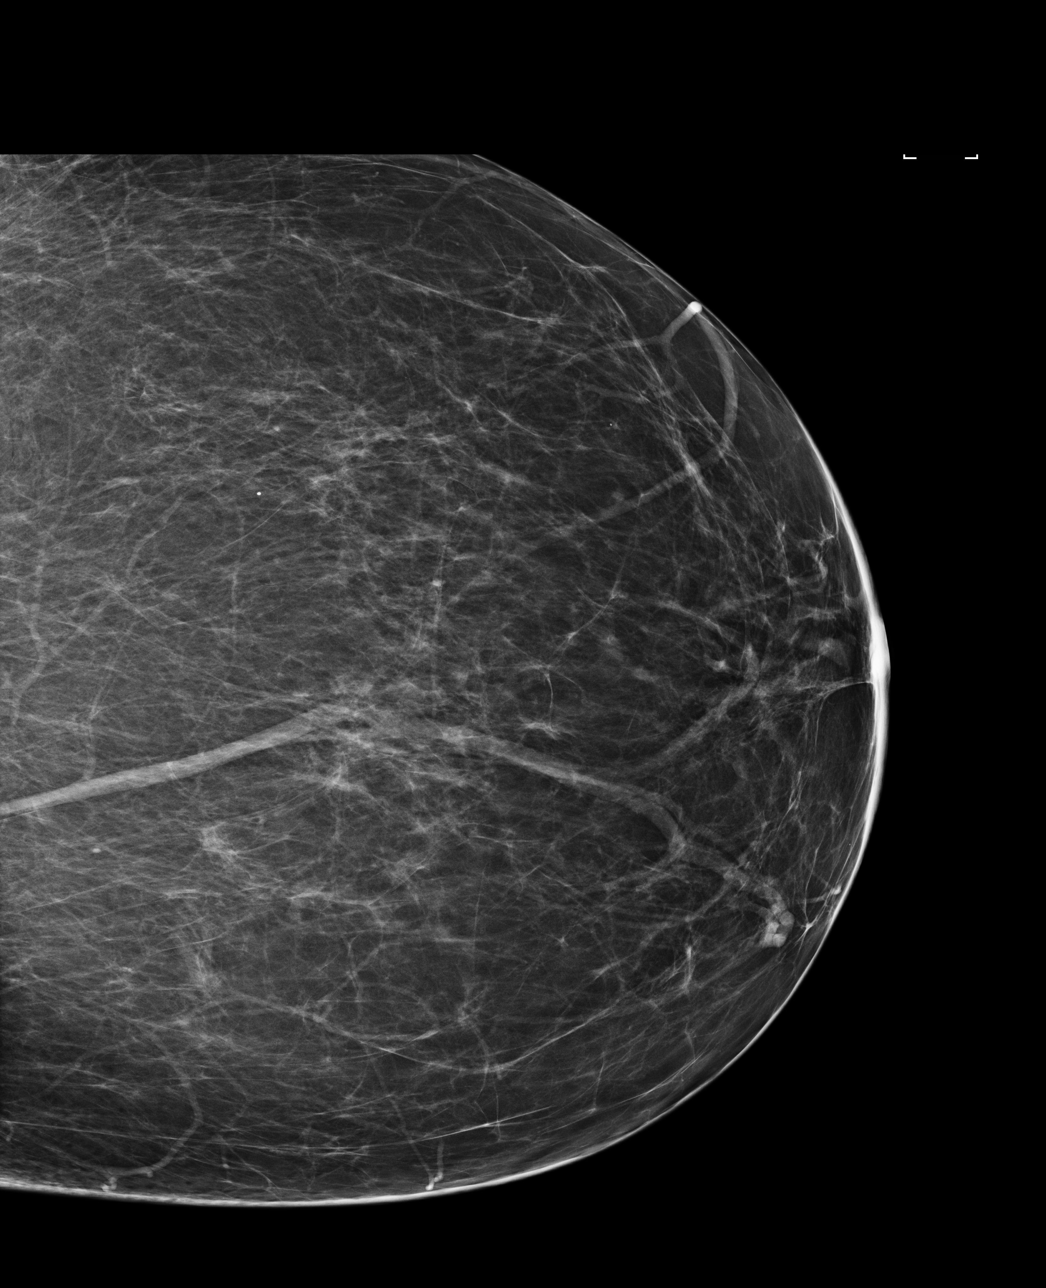

[L MLO]
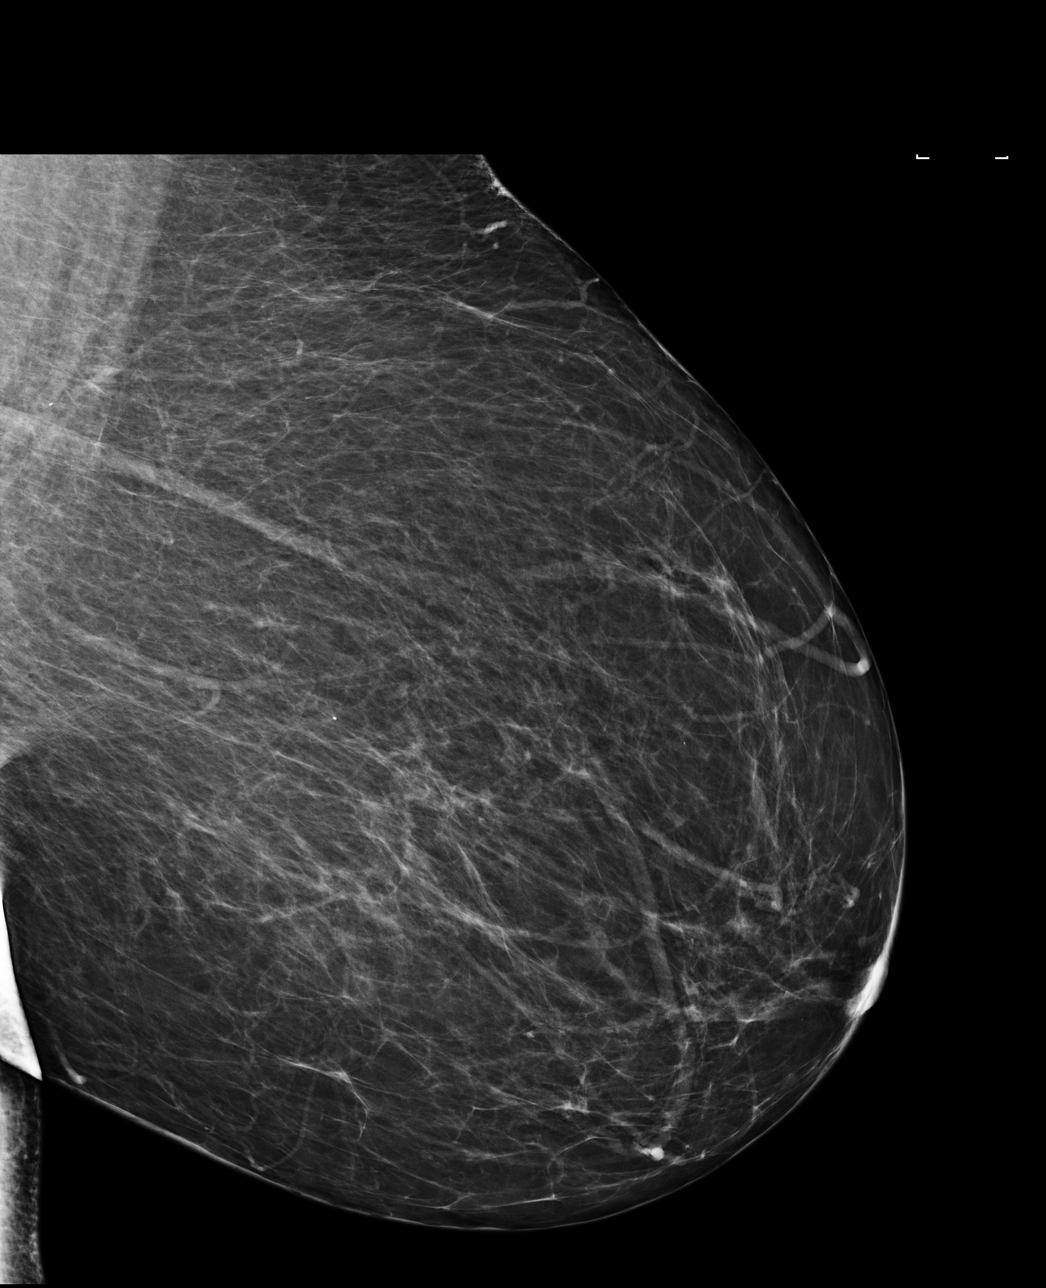

[R MLO]
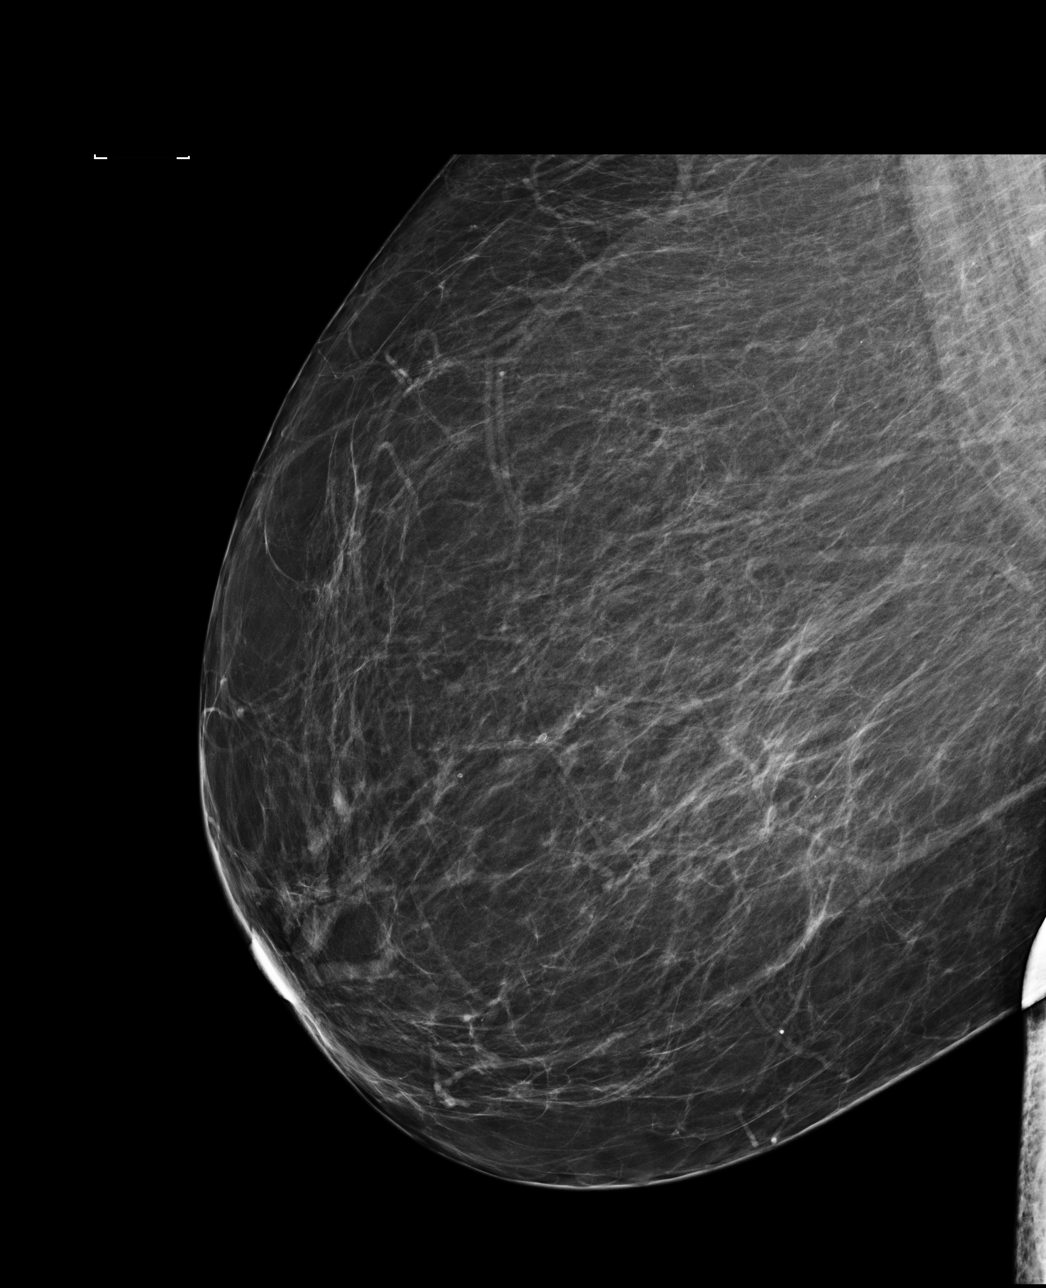

[4 of 4 positions shown; findings below may reference images not displayed]

ACR Breast Density Category b: There are scattered areas of
fibroglandular density.
FINDINGS: There are no findings suspicious for malignancy. Images were
processed with CAD.
IMPRESSION: No mammographic evidence of malignancy. A result letter of this
screening mammogram will be mailed directly to the patient.

RECOMMENDATION:
Screening mammogram in one year. (Code:AS-G-LCT)

BI-RADS CATEGORY  1: Negative.

## 2021-03-03 ENCOUNTER — Other Ambulatory Visit: Payer: Self-pay

## 2021-03-03 ENCOUNTER — Emergency Department (HOSPITAL_BASED_OUTPATIENT_CLINIC_OR_DEPARTMENT_OTHER)
Admission: EM | Admit: 2021-03-03 | Discharge: 2021-03-03 | Disposition: A | Discharge status: Home / Self Care | Payer: Medicare Other | Attending: Emergency Medicine | Admitting: Emergency Medicine

## 2021-03-03 ENCOUNTER — Encounter (HOSPITAL_BASED_OUTPATIENT_CLINIC_OR_DEPARTMENT_OTHER): Payer: Self-pay | Admitting: Emergency Medicine

## 2021-03-03 DIAGNOSIS — E119 Type 2 diabetes mellitus without complications: Secondary | ICD-10-CM | POA: Insufficient documentation

## 2021-03-03 DIAGNOSIS — Z7984 Long term (current) use of oral hypoglycemic drugs: Secondary | ICD-10-CM | POA: Insufficient documentation

## 2021-03-03 DIAGNOSIS — R110 Nausea: Secondary | ICD-10-CM | POA: Insufficient documentation

## 2021-03-03 DIAGNOSIS — E860 Dehydration: Secondary | ICD-10-CM

## 2021-03-03 DIAGNOSIS — R5383 Other fatigue: Secondary | ICD-10-CM | POA: Diagnosis present

## 2021-03-03 DIAGNOSIS — J45909 Unspecified asthma, uncomplicated: Secondary | ICD-10-CM | POA: Insufficient documentation

## 2021-03-03 DIAGNOSIS — R630 Anorexia: Secondary | ICD-10-CM | POA: Insufficient documentation

## 2021-03-03 DIAGNOSIS — R519 Headache, unspecified: Secondary | ICD-10-CM | POA: Diagnosis not present

## 2021-03-03 HISTORY — DX: Unspecified asthma, uncomplicated: J45.909

## 2021-03-03 LAB — COMPREHENSIVE METABOLIC PANEL
ALT: 12 U/L (ref 0–44)
AST: 20 U/L (ref 15–41)
Albumin: 4.1 g/dL (ref 3.5–5.0)
Alkaline Phosphatase: 42 U/L (ref 38–126)
Anion gap: 10 (ref 5–15)
BUN: 13 mg/dL (ref 8–23)
CO2: 24 mmol/L (ref 22–32)
Calcium: 8.9 mg/dL (ref 8.9–10.3)
Chloride: 101 mmol/L (ref 98–111)
Creatinine, Ser: 0.95 mg/dL (ref 0.44–1.00)
GFR, Estimated: >60 mL/min (ref >60)
Glucose, Bld: 153 mg/dL — ABNORMAL HIGH (ref 70–99)
Potassium: 3.5 mmol/L (ref 3.5–5.1)
Sodium: 135 mmol/L (ref 135–145)
Total Bilirubin: 0.6 mg/dL (ref 0.3–1.2)
Total Protein: 7 g/dL (ref 6.5–8.1)

## 2021-03-03 LAB — CBC WITH DIFFERENTIAL/PLATELET
Abs Immature Granulocytes: 0.03 10*3/uL (ref 0.00–0.07)
Basophils Absolute: 0.1 10*3/uL (ref 0.0–0.1)
Basophils Relative: 1 %
Eosinophils Absolute: 0.4 10*3/uL (ref 0.0–0.5)
Eosinophils Relative: 4 %
HCT: 34.9 % — ABNORMAL LOW (ref 36.0–46.0)
Hemoglobin: 11.4 g/dL — ABNORMAL LOW (ref 12.0–15.0)
Immature Granulocytes: 0 %
Lymphocytes Relative: 28 %
Lymphs Abs: 2.5 10*3/uL (ref 0.7–4.0)
MCH: 28.4 pg (ref 26.0–34.0)
MCHC: 32.7 g/dL (ref 30.0–36.0)
MCV: 87 fL (ref 80.0–100.0)
Monocytes Absolute: 0.8 10*3/uL (ref 0.1–1.0)
Monocytes Relative: 9 %
Neutro Abs: 5.1 10*3/uL (ref 1.7–7.7)
Neutrophils Relative %: 58 %
Platelets: 352 10*3/uL (ref 150–400)
RBC: 4.01 MIL/uL (ref 3.87–5.11)
RDW: 13 % (ref 11.5–15.5)
WBC: 8.8 10*3/uL (ref 4.0–10.5)
nRBC: 0 % (ref 0.0–0.2)

## 2021-03-03 LAB — CBG MONITORING, ED: Glucose-Capillary: 165 mg/dL — ABNORMAL HIGH (ref 70–99)

## 2021-03-03 MED ORDER — METOCLOPRAMIDE HCL 10 MG PO TABS: 10.0000 mg | ORAL_TABLET | Freq: Four times a day (QID) | ORAL | 0 refills | Status: AC | PRN

## 2021-03-03 MED ORDER — SODIUM CHLORIDE 0.9 % IV BOLUS
1000.0000 mL | Freq: Once | INTRAVENOUS | Status: AC
  Administered 2021-03-03: 1000 mL via INTRAVENOUS

## 2021-03-03 MED ORDER — ONDANSETRON HCL 4 MG/2ML IJ SOLN
4.0000 mg | Freq: Once | INTRAMUSCULAR | Status: AC
  Administered 2021-03-03: 4 mg via INTRAVENOUS
  Filled 2021-03-03: qty 2

## 2021-03-03 MED ORDER — ONDANSETRON 4 MG PO TBDP: ORAL_TABLET | ORAL | 0 refills | Status: AC

## 2021-03-03 MED ORDER — METOCLOPRAMIDE HCL 10 MG PO TABS
10.0000 mg | ORAL_TABLET | Freq: Once | ORAL | Status: AC
  Administered 2021-03-03: 10 mg via ORAL
  Filled 2021-03-03: qty 1

## 2021-03-03 NOTE — ED Triage Notes (Signed)
Pt reports R eye surgery removal on Wed, reports slight HA, generalized weakness since. Pt also reports feeling dizzy when standing. Pt denies bleeding or any s/s of infection at eye. Reports CBG at home today 117. Decreased appetite.

## 2021-03-03 NOTE — Discharge Instructions (Addendum)
Your labs are normal today.  Please take reglan for nausea.  Stay hydrated  See your doctor for follow-up.  Please follow-up with your eye doctor this week  Return to ER if you have worse headaches, vomiting, dehydration

## 2021-03-03 NOTE — ED Provider Notes (Signed)
[facility] EMERGENCY DEPARTMENT Provider Note   CSN: [id number] Arrival date & time: 03/03/21  1410     History Chief Complaint  Patient presents with  . Fatigue    Kim Mccoy is a 70 y.o. female hx of asthma, DM, here with fatigue.  Patient had right eye evisceration surgery done at [facility] on 5/4.  She is now postop day #4.  Patient had a postop visit 3 days ago.  At that time she was feeling nauseated and had poor appetite.  She was told this is likely side effect of anesthesia.  The last several days she is continues to have poor appetite.  She is able to keep fluids down has no vomiting.  She has some headaches and persistent nausea.  She was called by the office yesterday and was told to come in to get some IV fluids.  She states that her blood sugar was in the low 100s at home.  Denies any fevers  The history is provided by the patient.       Past Medical History:  Diagnosis Date  . Asthma   . Diabetes mellitus without complication [facility])     Patient Active Problem List   Diagnosis Date Noted  . Left knee injury, subsequent encounter 02/16/2017  . Right foot pain 04/01/2016  . Fracture of third metatarsal bone of right foot with routine healing 01/29/2016  . Left shoulder pain 07/18/2015  . Avitaminosis D 04/02/2013  . Cannot sleep 11/15/2012  . Adjustment disorder with mixed anxiety and depressed mood 11/15/2012  . Airway hyperreactivity 08/27/2012  . 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 Vitreous degeneration 03/31/2011  . 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 DIABETES, TYPE 2 05/18/2009  . HYPERLIPIDEMIA 05/18/2009  . ANXIETY 05/18/2009  . SNORING 05/18/2009    Past Surgical History:  Procedure Laterality Date  . EYE SURGERY       OB History   No obstetric history on file.     History reviewed. No pertinent family history.  Social History   Tobacco Use   . Smoking status: Never Smoker  . Smokeless tobacco: Never Used  Substance Use Topics  . Alcohol use: No    Alcohol/week: 0.0 standard drinks  . Drug use: No    Home Medications Prior to Admission medications   Medication Sig Start Date End Date Taking? Authorizing Provider  ACCU-CHEK AVIVA PLUS test strip 2 (two) times daily. for testing 06/10/15   [provider]  albuterol (PROVENTIL HFA;VENTOLIN HFA) 108 (90 BASE) MCG/ACT inhaler Inhale 2 puffs into the lungs. 01/20/15   [provider]  Blood Glucose Monitoring Suppl (ACCU-CHEK AVIVA PLUS) W/DEVICE KIT See admin instructions. 06/10/15   [provider]  clonazePAM (KLONOPIN) 1 MG tablet TAKE ONE TABLET BY MOUTH TWICE A DAY IF NEEDED. 07/02/15   [provider]  Fenofibrate 50 MG CAPS Take 50 mg by mouth daily. 06/29/15   [provider]  JANUVIA 100 MG tablet  08/11/15   [provider]  latanoprost (XALATAN) 0.005 % ophthalmic solution 1 drop at bedtime.    [provider]  montelukast (SINGULAIR) 10 MG tablet Take 10 mg by mouth at bedtime. 08/25/15   [provider]  pioglitazone-glimepiride (DUETACT) 30-2 MG per tablet  06/18/15   [provider]  pravastatin (PRAVACHOL) 20 MG tablet  01/13/17   [provider]  prednisoLONE acetate (PRED FORTE) 1 %  ophthalmic suspension Apply to eye. 12/30/12   [provider]  QUEtiapine (SEROQUEL) 50 MG tablet Take 50 mg by mouth at bedtime. 07/11/15   [provider]  sertraline (ZOLOFT) 100 MG tablet  01/22/17   [provider]  timolol (TIMOPTIC) 0.5 % ophthalmic solution Apply to eye. 12/08/13   [provider]  Vitamin D, Ergocalciferol, (DRISDOL) 50000 UNITS CAPS capsule  07/08/15   [provider]  zolpidem (AMBIEN) 10 MG tablet Take 10 mg by mouth at bedtime as needed.    [provider]    Allergies    Azithromycin, Erythromycin, Hydrocodone, Penicillin  g, and Sulfa antibiotics  Review of Systems   Review of Systems  Neurological: Positive for dizziness and headaches.  All other systems reviewed and are negative.   Physical Exam Updated Vital Signs BP 129/63 (BP Location: Right Arm)   Pulse 76   Temp 98.5 F (36.9 C) (Oral)   Resp 18   Ht 5' 6" (1.676 m)   Wt 91.6 kg   SpO2 100%   BMI 32.60 kg/m   Physical Exam Vitals and nursing note reviewed.  Constitutional:      Appearance: Normal appearance.  HENT:     Head: Normocephalic.     Nose: Nose normal.     Mouth/Throat:     Mouth: Mucous membranes are moist.  Eyes:     Comments: Right eye eviscerated.  No obvious purulent discharge. Left eye extra ocular movement intact   Cardiovascular:     Rate and Rhythm: Normal rate and regular rhythm.     Pulses: Normal pulses.     Heart sounds: Normal heart sounds.  Pulmonary:     Effort: Pulmonary effort is normal.     Breath sounds: Normal breath sounds.  Abdominal:     General: Abdomen is flat.     Palpations: Abdomen is soft.  Musculoskeletal:        General: Normal range of motion.     Cervical back: Normal range of motion and neck supple.  Skin:    General: Skin is warm.     Capillary Refill: Capillary refill takes less than 2 seconds.  Neurological:     General: No focal deficit present.     Mental Status: She is alert and oriented to person, place, and time.  Psychiatric:        Mood and Affect: Mood normal.        Behavior: Behavior normal.     ED Results / Procedures / Treatments   Labs (all labs ordered are listed, but only abnormal results are displayed) Labs Reviewed  CBG MONITORING, ED - Abnormal; Notable for the following components:      Result Value   Glucose-Capillary 165 (*)    All other components within normal limits  CBC WITH DIFFERENTIAL/PLATELET  COMPREHENSIVE METABOLIC PANEL    EKG EKG Interpretation  Date/Time:  Sunday Mar 03 2021 14:38:24 EDT Ventricular Rate:  76 PR  Interval:  128 QRS Duration: 80 QT Interval:  396 QTC Calculation: 445 R Axis:   -17 Text Interpretation: Sinus rhythm with Premature atrial complexes with Abberant conduction Otherwise normal ECG No significant change since last tracing Confirmed by Blanchie Dessert [phone]) on 03/03/2021 2:41:28 PM   Radiology No results found.  Procedures Procedures   Medications Ordered in ED Medications  sodium chloride 0.9 % bolus 1,000 mL (has no administration in time range)  ondansetron (ZOFRAN) injection 4 mg (has no administration in time range)  ED Course  I have reviewed the triage vital signs and the nursing notes.  Pertinent labs & imaging results that were available during my care of the patient were reviewed by me and considered in my medical decision making (see chart for details).    MDM Rules/Calculators/A&P                         Kim Mccoy is a 70 y.o. female here presenting with headache and nausea.  Patient just had a right eye evisceration surgery done on 5/4.  She likely has side effect of the anesthesia.  Plan to get CBC and CMP and hydrate patient.  Patient has nonfocal neuro exam right now.   4:37 PM Labs unremarkable.  Given IV fluids and felt better.  Nausea improved with Zofran.  Stable for discharge.  Told her to follow-up with her surgeon this week   Final Clinical Impression(s) / ED Diagnoses Final diagnoses:  None    Rx / DC Orders ED Discharge Orders    None       Drenda Freeze, MD 03/03/21 [phone]

## 2021-03-03 NOTE — ED Notes (Signed)
Attempted IV x 2; able to obtain some blood with 2nd attempt.

## 2021-03-03 NOTE — ED Notes (Signed)
Pt discharged to home. Discharge instructions have been discussed with patient and/or family members. Pt verbally acknowledges understanding d/c instructions, and endorses comprehension to checkout at registration before leaving.  °

## 2021-03-03 NOTE — ED Notes (Signed)
Pt c/o fatigue and generally not feeling well.

## 2021-03-14 ENCOUNTER — Other Ambulatory Visit (HOSPITAL_BASED_OUTPATIENT_CLINIC_OR_DEPARTMENT_OTHER): Payer: Self-pay | Admitting: Obstetrics and Gynecology

## 2021-03-14 DIAGNOSIS — Z1231 Encounter for screening mammogram for malignant neoplasm of breast: Secondary | ICD-10-CM

## 2021-05-06 ENCOUNTER — Other Ambulatory Visit (HOSPITAL_BASED_OUTPATIENT_CLINIC_OR_DEPARTMENT_OTHER): Payer: Self-pay | Admitting: Obstetrics and Gynecology

## 2021-05-06 ENCOUNTER — Encounter (HOSPITAL_BASED_OUTPATIENT_CLINIC_OR_DEPARTMENT_OTHER): Payer: Self-pay

## 2021-05-06 ENCOUNTER — Ambulatory Visit (HOSPITAL_BASED_OUTPATIENT_CLINIC_OR_DEPARTMENT_OTHER)
Admission: RE | Admit: 2021-05-06 | Discharge: 2021-05-06 | Disposition: A | Discharge status: Home / Self Care | Payer: Medicare Other | Source: Ambulatory Visit | Attending: Obstetrics and Gynecology | Admitting: Obstetrics and Gynecology

## 2021-05-06 ENCOUNTER — Other Ambulatory Visit: Payer: Self-pay

## 2021-05-06 DIAGNOSIS — Z1231 Encounter for screening mammogram for malignant neoplasm of breast: Secondary | ICD-10-CM | POA: Diagnosis not present

## 2022-02-27 ENCOUNTER — Ambulatory Visit: Payer: Medicare Other | Admitting: Physician Assistant

## 2022-04-03 ENCOUNTER — Other Ambulatory Visit (HOSPITAL_BASED_OUTPATIENT_CLINIC_OR_DEPARTMENT_OTHER): Payer: Self-pay | Admitting: Obstetrics and Gynecology

## 2022-04-03 DIAGNOSIS — Z1231 Encounter for screening mammogram for malignant neoplasm of breast: Secondary | ICD-10-CM

## 2022-05-14 ENCOUNTER — Ambulatory Visit (HOSPITAL_BASED_OUTPATIENT_CLINIC_OR_DEPARTMENT_OTHER)
Admission: RE | Admit: 2022-05-14 | Discharge: 2022-05-14 | Disposition: A | Discharge status: Home / Self Care | Payer: Medicare Other | Source: Ambulatory Visit | Attending: Obstetrics and Gynecology | Admitting: Obstetrics and Gynecology

## 2022-05-14 ENCOUNTER — Encounter (HOSPITAL_BASED_OUTPATIENT_CLINIC_OR_DEPARTMENT_OTHER): Payer: Self-pay

## 2022-05-14 DIAGNOSIS — Z1231 Encounter for screening mammogram for malignant neoplasm of breast: Secondary | ICD-10-CM | POA: Diagnosis present

## 2022-06-11 ENCOUNTER — Ambulatory Visit (HOSPITAL_BASED_OUTPATIENT_CLINIC_OR_DEPARTMENT_OTHER): Payer: Medicare Other

## 2022-12-15 LAB — EXTERNAL GENERIC LAB PROCEDURE

## 2023-01-02 LAB — EXTERNAL GENERIC LAB PROCEDURE

## 2023-04-08 ENCOUNTER — Other Ambulatory Visit (HOSPITAL_BASED_OUTPATIENT_CLINIC_OR_DEPARTMENT_OTHER): Payer: Self-pay | Admitting: Obstetrics and Gynecology

## 2023-04-08 DIAGNOSIS — Z1231 Encounter for screening mammogram for malignant neoplasm of breast: Secondary | ICD-10-CM

## 2023-05-19 ENCOUNTER — Inpatient Hospital Stay (HOSPITAL_BASED_OUTPATIENT_CLINIC_OR_DEPARTMENT_OTHER): Admission: RE | Admit: 2023-05-19 | Payer: Medicare Other | Source: Ambulatory Visit

## 2023-05-19 ENCOUNTER — Encounter (HOSPITAL_BASED_OUTPATIENT_CLINIC_OR_DEPARTMENT_OTHER): Payer: Self-pay

## 2024-05-19 ENCOUNTER — Other Ambulatory Visit (HOSPITAL_BASED_OUTPATIENT_CLINIC_OR_DEPARTMENT_OTHER): Payer: Self-pay

## 2024-05-19 DIAGNOSIS — Z1231 Encounter for screening mammogram for malignant neoplasm of breast: Secondary | ICD-10-CM

## 2024-05-24 ENCOUNTER — Ambulatory Visit (HOSPITAL_BASED_OUTPATIENT_CLINIC_OR_DEPARTMENT_OTHER)

## 2024-06-23 ENCOUNTER — Encounter (HOSPITAL_BASED_OUTPATIENT_CLINIC_OR_DEPARTMENT_OTHER): Payer: Self-pay

## 2024-06-23 ENCOUNTER — Ambulatory Visit (HOSPITAL_BASED_OUTPATIENT_CLINIC_OR_DEPARTMENT_OTHER)
Admission: RE | Admit: 2024-06-23 | Discharge: 2024-06-23 | Disposition: A | Discharge status: Home / Self Care | Source: Ambulatory Visit

## 2024-06-23 DIAGNOSIS — Z1231 Encounter for screening mammogram for malignant neoplasm of breast: Secondary | ICD-10-CM | POA: Insufficient documentation

## 2024-06-28 ENCOUNTER — Telehealth: Payer: Self-pay

## 2024-06-28 NOTE — Telephone Encounter (Signed)
 Patient called the [facility] land line asking for assistance with some one she indicates called her from [facility]. Upon review from the patient she stated that she just received care from [facility] in [facility] and was given their phone number to return their call. Patient called back a second time after not receiving an answer from [facility]. I advised the patient to call the number given and to go through the prompts to speak with someone in imaging as the patient requested and reminded the patient that I am unable to see who called the patient and to leave the office a voicemail for a return call. Patient disconnect the call.
